# Patient Record
Sex: Female | Born: 1985 | Race: White | Hispanic: No | Marital: Married | Smoking: Never smoker
Health system: Southern US, Community
[De-identification: ages and names within clinical notes are randomized; demographics above are authoritative.]

## PROBLEM LIST (undated history)

## (undated) DIAGNOSIS — Z8742 Personal history of other diseases of the female genital tract: Secondary | ICD-10-CM

## (undated) DIAGNOSIS — Q51810 Arcuate uterus: Secondary | ICD-10-CM

## (undated) DIAGNOSIS — R51 Headache: Secondary | ICD-10-CM

## (undated) DIAGNOSIS — M67439 Ganglion, unspecified wrist: Secondary | ICD-10-CM

## (undated) DIAGNOSIS — R87629 Unspecified abnormal cytological findings in specimens from vagina: Secondary | ICD-10-CM

## (undated) DIAGNOSIS — N200 Calculus of kidney: Secondary | ICD-10-CM

## (undated) HISTORY — DX: Personal history of other diseases of the female genital tract: Z87.42

## (undated) HISTORY — DX: Unspecified abnormal cytological findings in specimens from vagina: R87.629

## (undated) HISTORY — PX: CARPAL TUNNEL RELEASE: SHX101

## (undated) HISTORY — PX: LAPAROSCOPIC CHOLECYSTECTOMY: SUR755

## (undated) HISTORY — DX: Arcuate uterus: Q51.810

## (undated) HISTORY — DX: Calculus of kidney: N20.0

## (undated) HISTORY — PX: LEEP: SHX91

## (undated) HISTORY — PX: COLPOSCOPY: SHX161

---

## 2011-10-22 DIAGNOSIS — M67439 Ganglion, unspecified wrist: Secondary | ICD-10-CM

## 2011-10-22 HISTORY — DX: Ganglion, unspecified wrist: M67.439

## 2011-11-21 ENCOUNTER — Encounter (HOSPITAL_BASED_OUTPATIENT_CLINIC_OR_DEPARTMENT_OTHER): Payer: Self-pay | Admitting: *Deleted

## 2011-11-22 ENCOUNTER — Other Ambulatory Visit: Payer: Self-pay | Admitting: Orthopedic Surgery

## 2011-11-27 NOTE — H&P (Signed)
  Terri Zavala is an 26 y.o. female.   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 She has had prior right hand symptoms with numbness and is s/p right carpal tunnel release by Dr. Mayford Knife in [place], [place] in 2008. She has had pain doing push ups and with full palmar flexion of her wrist for at least 4 years. She now notes a bump in the 4th dorsal compartment consistent with subretinacular dorsal ganglion.     Past Medical History  Diagnosis Date  . Headache     migraines  . Ganglion cyst of wrist 10/2011    right    Past Surgical History  Procedure Date  . Laparoscopic cholecystectomy   . Carpal tunnel release     right    History reviewed. No pertinent family history. Social History:  reports that she has never smoked. She has never used smokeless tobacco. She reports that she drinks alcohol. She reports that she does not use illicit drugs.  Allergies:  Allergies  Allergen Reactions  . Imitrex (Sumatriptan) Other (See Comments)    TACHYCARDIA  . Latex Hives  . Adhesive (Tape) Other (See Comments)    SKIN IRRITATION    No prescriptions prior to admission    No results found for this or any previous visit (from the past 48 hour(s)).  No results found.   Pertinent items are noted in HPI.  Height 5\' 5"  (1.651 m), weight 62.596 kg (138 lb), last menstrual period 11/16/2011.  General appearance: alert Head: Normocephalic, without obvious abnormality Neck: supple, symmetrical, trachea midline Resp: clear to auscultation bilaterally Cardio: regular rate and rhythm GI: normal findings: bowel sounds normal Extremities:  Inspection of her hands reveals a 1.5 by 1.2 mm mass on the dorsal aspect of her left wrist most notable with full palmar flexion of the wrist. She is tender on direct palpation. She cannot do a push up. She has full ROM of her fingers in flexion/extension. Her pulses and capillary refill are  unremarkable.   X-rays of her wrist 4 views demonstrate normal bony anatomy. She is ulnar neutral. There is no sign of scapholunate diastasis. Her lunate is neutral on the lateral view.    Pulses: 2+ and symmetric Skin: normal Neurologic: Grossly normal    Assessment/Plan Impression:Right wrist dorsal ganglion cyst  Plan:Excision cyst dorsum right wrist.The procedure, risks,benefits and post-op course were discussed with the patient at length and they were in agreement with the plan.   DASNOIT,Saathvik Every J 11/27/2011, 2:20 PM     H&P documentation: 11/28/2011  -History and Physical Reviewed  -Patient has been re-examined  -No change in the plan of care  Wyn Forster, MD

## 2011-11-28 ENCOUNTER — Ambulatory Visit (HOSPITAL_BASED_OUTPATIENT_CLINIC_OR_DEPARTMENT_OTHER): Payer: BC Managed Care – PPO | Admitting: Certified Registered Nurse Anesthetist

## 2011-11-28 ENCOUNTER — Encounter (HOSPITAL_BASED_OUTPATIENT_CLINIC_OR_DEPARTMENT_OTHER): Payer: Self-pay | Admitting: *Deleted

## 2011-11-28 ENCOUNTER — Encounter (HOSPITAL_BASED_OUTPATIENT_CLINIC_OR_DEPARTMENT_OTHER): Payer: Self-pay | Admitting: Certified Registered Nurse Anesthetist

## 2011-11-28 ENCOUNTER — Encounter (HOSPITAL_BASED_OUTPATIENT_CLINIC_OR_DEPARTMENT_OTHER)
Admission: RE | Disposition: A | Discharge status: Home / Self Care | Payer: Self-pay | Source: Ambulatory Visit | Attending: Orthopedic Surgery

## 2011-11-28 ENCOUNTER — Ambulatory Visit (HOSPITAL_BASED_OUTPATIENT_CLINIC_OR_DEPARTMENT_OTHER)
Admission: RE | Admit: 2011-11-28 | Discharge: 2011-11-28 | Disposition: A | Discharge status: Home / Self Care | Payer: BC Managed Care – PPO | Source: Ambulatory Visit | Attending: Orthopedic Surgery | Admitting: Orthopedic Surgery

## 2011-11-28 DIAGNOSIS — M6740 Ganglion, unspecified site: Secondary | ICD-10-CM | POA: Insufficient documentation

## 2011-11-28 DIAGNOSIS — Z91040 Latex allergy status: Secondary | ICD-10-CM | POA: Insufficient documentation

## 2011-11-28 DIAGNOSIS — G43909 Migraine, unspecified, not intractable, without status migrainosus: Secondary | ICD-10-CM | POA: Insufficient documentation

## 2011-11-28 HISTORY — DX: Ganglion, unspecified wrist: M67.439

## 2011-11-28 HISTORY — DX: Headache: R51

## 2011-11-28 HISTORY — PX: GANGLION CYST EXCISION: SHX1691

## 2011-11-28 LAB — POCT HEMOGLOBIN-HEMACUE: Hemoglobin: 13.9 g/dL (ref 12.0–15.0)

## 2011-11-28 SURGERY — EXCISION, GANGLION CYST, WRIST
Anesthesia: General | Site: Wrist | Laterality: Right | Wound class: Clean

## 2011-11-28 MED ORDER — LIDOCAINE HCL (CARDIAC) 20 MG/ML IV SOLN
INTRAVENOUS | Status: DC | PRN
  Administered 2011-11-28: 60 mg via INTRAVENOUS

## 2011-11-28 MED ORDER — DEXAMETHASONE SODIUM PHOSPHATE 10 MG/ML IJ SOLN
INTRAMUSCULAR | Status: DC | PRN
  Administered 2011-11-28: 10 mg via INTRAVENOUS

## 2011-11-28 MED ORDER — LIDOCAINE HCL 2 % IJ SOLN
INTRAMUSCULAR | Status: DC | PRN
  Administered 2011-11-28: 2 mL

## 2011-11-28 MED ORDER — DOXYCYCLINE HYCLATE 100 MG PO TABS: 100.0000 mg | ORAL_TABLET | Freq: Two times a day (BID) | ORAL | Status: DC

## 2011-11-28 MED ORDER — PROPOFOL 10 MG/ML IV BOLUS
INTRAVENOUS | Status: DC | PRN
  Administered 2011-11-28: 200 mg via INTRAVENOUS

## 2011-11-28 MED ORDER — ONDANSETRON HCL 4 MG/2ML IJ SOLN: 4.0000 mg | Freq: Once | INTRAMUSCULAR | Status: DC | PRN

## 2011-11-28 MED ORDER — CHLORHEXIDINE GLUCONATE 4 % EX LIQD: 60.0000 mL | Freq: Once | CUTANEOUS | Status: DC

## 2011-11-28 MED ORDER — LACTATED RINGERS IV SOLN
INTRAVENOUS | Status: DC
  Administered 2011-11-28 (×2): via INTRAVENOUS

## 2011-11-28 MED ORDER — MIDAZOLAM HCL 5 MG/5ML IJ SOLN
INTRAMUSCULAR | Status: DC | PRN
  Administered 2011-11-28: 2 mg via INTRAVENOUS

## 2011-11-28 MED ORDER — FENTANYL CITRATE 0.05 MG/ML IJ SOLN
INTRAMUSCULAR | Status: DC | PRN
  Administered 2011-11-28: 50 ug via INTRAVENOUS

## 2011-11-28 MED ORDER — HYDROMORPHONE HCL PF 1 MG/ML IJ SOLN
0.2500 mg | INTRAMUSCULAR | Status: DC | PRN
  Administered 2011-11-28 (×2): 0.25 mg via INTRAVENOUS

## 2011-11-28 MED ORDER — OXYCODONE HCL 5 MG/5ML PO SOLN
5.0000 mg | Freq: Once | ORAL | Status: DC | PRN
Start: 2011-11-28 — End: 2011-11-28

## 2011-11-28 MED ORDER — SCOPOLAMINE 1 MG/3DAYS TD PT72
MEDICATED_PATCH | TRANSDERMAL | Status: DC | PRN
  Administered 2011-11-28: 1 via TRANSDERMAL

## 2011-11-28 MED ORDER — ONDANSETRON HCL 4 MG/2ML IJ SOLN
INTRAMUSCULAR | Status: DC | PRN
  Administered 2011-11-28: 4 mg via INTRAVENOUS

## 2011-11-28 MED ORDER — OXYCODONE HCL 5 MG PO TABS: 5.0000 mg | ORAL_TABLET | Freq: Once | ORAL | Status: DC | PRN

## 2011-11-28 SURGICAL SUPPLY — 44 items
BANDAGE ADHESIVE 1X3 (GAUZE/BANDAGES/DRESSINGS) IMPLANT
BANDAGE ELASTIC 3 VELCRO ST LF (GAUZE/BANDAGES/DRESSINGS) ×2 IMPLANT
BANDAGE GAUZE ELAST BULKY 4 IN (GAUZE/BANDAGES/DRESSINGS) IMPLANT
BLADE MINI RND TIP GREEN BEAV (BLADE) IMPLANT
BLADE SURG 15 STRL LF DISP TIS (BLADE) ×1 IMPLANT
BLADE SURG 15 STRL SS (BLADE) ×1
BNDG ESMARK 4X9 LF (GAUZE/BANDAGES/DRESSINGS) ×2 IMPLANT
BRUSH SCRUB EZ PLAIN DRY (MISCELLANEOUS) ×2 IMPLANT
CLOTH BEACON ORANGE TIMEOUT ST (SAFETY) ×2 IMPLANT
CORDS BIPOLAR (ELECTRODE) ×2 IMPLANT
COVER MAYO STAND STRL (DRAPES) ×2 IMPLANT
COVER TABLE BACK 60X90 (DRAPES) ×2 IMPLANT
CUFF TOURNIQUET SINGLE 18IN (TOURNIQUET CUFF) ×2 IMPLANT
DECANTER SPIKE VIAL GLASS SM (MISCELLANEOUS) ×2 IMPLANT
DRAPE EXTREMITY T 121X128X90 (DRAPE) ×2 IMPLANT
DRAPE SURG 17X23 STRL (DRAPES) ×2 IMPLANT
GLOVE BIOGEL M STRL SZ7.5 (GLOVE) IMPLANT
GLOVE BIOGEL PI IND STRL 7.0 (GLOVE) ×1 IMPLANT
GLOVE BIOGEL PI IND STRL 7.5 (GLOVE) ×2 IMPLANT
GLOVE BIOGEL PI INDICATOR 7.0 (GLOVE) ×1
GLOVE BIOGEL PI INDICATOR 7.5 (GLOVE) ×2
GLOVE ORTHO TXT STRL SZ7.5 (GLOVE) IMPLANT
GOWN PREVENTION PLUS XLARGE (GOWN DISPOSABLE) ×2 IMPLANT
GOWN PREVENTION PLUS XXLARGE (GOWN DISPOSABLE) ×4 IMPLANT
NEEDLE 27GAX1X1/2 (NEEDLE) ×2 IMPLANT
PACK BASIN DAY SURGERY FS (CUSTOM PROCEDURE TRAY) ×2 IMPLANT
PAD CAST 3X4 CTTN HI CHSV (CAST SUPPLIES) ×1 IMPLANT
PADDING CAST ABS 4INX4YD NS (CAST SUPPLIES) ×1
PADDING CAST ABS COTTON 4X4 ST (CAST SUPPLIES) ×1 IMPLANT
PADDING CAST COTTON 3X4 STRL (CAST SUPPLIES) ×1
SPLINT PLASTER CAST XFAST 3X15 (CAST SUPPLIES) ×4 IMPLANT
SPLINT PLASTER XTRA FASTSET 3X (CAST SUPPLIES) ×4
SPONGE GAUZE 4X4 12PLY (GAUZE/BANDAGES/DRESSINGS) ×2 IMPLANT
STOCKINETTE 4X48 STRL (DRAPES) ×2 IMPLANT
STRIP CLOSURE SKIN 1/2X4 (GAUZE/BANDAGES/DRESSINGS) IMPLANT
SUT PROLENE 3 0 PS 2 (SUTURE) ×2 IMPLANT
SUT VIC AB 4-0 P-3 18XBRD (SUTURE) ×1 IMPLANT
SUT VIC AB 4-0 P3 18 (SUTURE) ×1
SYR 3ML 23GX1 SAFETY (SYRINGE) IMPLANT
SYR CONTROL 10ML LL (SYRINGE) ×2 IMPLANT
TOWEL OR 17X24 6PK STRL BLUE (TOWEL DISPOSABLE) ×4 IMPLANT
TRAY DSU PREP LF (CUSTOM PROCEDURE TRAY) ×2 IMPLANT
UNDERPAD 30X30 INCONTINENT (UNDERPADS AND DIAPERS) ×2 IMPLANT
WATER STERILE IRR 1000ML POUR (IV SOLUTION) IMPLANT

## 2011-11-28 NOTE — Transfer of Care (Signed)
Immediate Anesthesia Transfer of Care Note  Patient: Terri Zavala  Procedure(s) Performed: Procedure(s) (LRB) with comments: REMOVAL GANGLION OF WRIST (Right) - CYST EXCISION RIGHT WRIST  Patient Location: PACU  Anesthesia Type:General  Level of Consciousness: awake, alert , oriented and patient cooperative  Airway & Oxygen Therapy: Patient Spontanous Breathing and Patient connected to face mask oxygen  Post-op Assessment: Report given to PACU RN and Post -op Vital signs reviewed and stable  Post vital signs: Reviewed and stable  Complications: No apparent anesthesia complications

## 2011-11-28 NOTE — Brief Op Note (Signed)
11/28/2011  8:20 AM  PATIENT:  Terri Zavala  26 y.o. female  PRE-OPERATIVE DIAGNOSIS:  DORSAL WRIST MYXOID CYST  POST-OPERATIVE DIAGNOSIS:  RIGHT WRIST SCAPHOLUNATE MYXOID DEGENERATIVE CYST  PROCEDURE:  EXCISION OF RIGHT WRIST SCAPHOLUNATE MYXOID CYST  SURGEON:  Surgeon(s) and Role:    * Wyn Forster., MD - Primary  PHYSICIAN ASSISTANT:   ASSISTANTS:Charlene Cowdrey Dasnoit,P.A-C   ANESTHESIA:   general  EBL:  Total I/O In: 1000 [I.V.:1000] Out: -   BLOOD ADMINISTERED:none  DRAINS: none   LOCAL MEDICATIONS USED:  XYLOCAINE   SPECIMEN:  No Specimen  DISPOSITION OF SPECIMEN:  N/A  COUNTS:  YES  TOURNIQUET:   Total Tourniquet Time Documented: Upper Arm (Right) - 21 minutes  DICTATION: .Other Dictation: Dictation Number [id number]  PLAN OF CARE: Discharge to home after PACU  PATIENT DISPOSITION:  PACU - hemodynamically stable.

## 2011-11-28 NOTE — Anesthesia Postprocedure Evaluation (Signed)
  Anesthesia Post-op Note  Patient: Terri Zavala  Procedure(s) Performed: Procedure(s) (LRB) with comments: REMOVAL GANGLION OF WRIST (Right) - CYST EXCISION RIGHT WRIST  Patient Location: PACU  Anesthesia Type:General  Level of Consciousness: awake, alert  and oriented  Airway and Oxygen Therapy: Patient Spontanous Breathing and Patient connected to face mask oxygen  Post-op Pain: mild  Post-op Assessment: Post-op Vital signs reviewed  Post-op Vital Signs: Reviewed  Complications: No apparent anesthesia complications

## 2011-11-28 NOTE — Anesthesia Preprocedure Evaluation (Signed)

## 2011-11-28 NOTE — Anesthesia Procedure Notes (Signed)
Procedure Name: LMA Insertion Date/Time: 11/28/2011 7:41 AM Performed by: Braeden Kennan D Pre-anesthesia Checklist: Patient identified, Emergency Drugs available, Suction available and Patient being monitored Patient Re-evaluated:Patient Re-evaluated prior to inductionOxygen Delivery Method: Circle System Utilized Preoxygenation: Pre-oxygenation with 100% oxygen Intubation Type: IV induction Ventilation: Mask ventilation without difficulty LMA: LMA inserted LMA Size: 4.0 Number of attempts: 1 Airway Equipment and Method: bite block Placement Confirmation: positive ETCO2 Tube secured with: Tape Dental Injury: Teeth and Oropharynx as per pre-operative assessment

## 2011-11-28 NOTE — Op Note (Signed)
[id number]  

## 2011-11-29 ENCOUNTER — Encounter (HOSPITAL_BASED_OUTPATIENT_CLINIC_OR_DEPARTMENT_OTHER): Payer: Self-pay | Admitting: Orthopedic Surgery

## 2011-12-02 NOTE — Op Note (Signed)
NAMECHARLITA, Zavala NO.:  [id number]  MEDICAL RECORD NO.:  [id number]  LOCATION:                                 FACILITY:  PHYSICIAN:  Katy Fitch. Zadaya Cuadra, M.D. DATE OF BIRTH:  28-Jul-1985  DATE OF PROCEDURE:  11/28/2011 DATE OF DISCHARGE:                              OPERATIVE REPORT   PREOPERATIVE DIAGNOSIS:  Three-year history of painful region dorsal aspect of right wrist with recent development of a palpable mass consistent with a sub-retinacular dorsal myxoid degenerative cyst.  POSTOPERATIVE DIAGNOSIS:  A 7 x 6-mm tense scapholunate myxoid cyst, intracapsular likely causing compression of posterior interosseous nerve, also very small flap tear of scapholunate ligament fibers based on lunate.  OPERATION:  Debridement of intracapsular, sub-retinacular myxoid cyst, right wrist.  OPERATING SURGEON:  Katy Fitch. Berk Pilot, M.D.  ASSISTANT:  Marveen Reeks Dasnoit, PA-C  ANESTHESIA:  General by LMA.  SUPERVISING ANESTHESIOLOGIST:  Sheldon Silvan, M.D.  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  She has had a 3-year history of increasing discomfort on the dorsal aspect of her right dominant wrist.  She was examined by my colleague, Cindee Salt 3 years prior and was thought to have a partial scapholunate interosseous ligament injury.  Over time, [name] developed a mass on the dorsal aspect of her wrist that was palpable in palmar flexion and had chronic discomfort with load- bearing in a push-up position.  She presented for evaluation several weeks prior and was noted to have a palpable sub-retinacular cyst.  We had detailed informed consent during, which we explained that the myxoid cyst phenomena was poorly understood.  This typically rise on the scapholunate ligament with a vague history  of injury.  They are typically debrided removing pressure on the posterior interosseous nerve.  However, the prognosis is in my experience uncertain.  The literature does not clearly document a natural history of this disorder.  After detailed informed consent, she decided to proceed with debridement.  Questions regarding the surgery were invited and answered in detail. After being provided a spectrum of options for anesthesia including local standby versus regional versus general by LMA, [name] chose general by LMA.  PROCEDURE:  [name] was brought to room #2 of the [facility] and placed in supine position on the operating table.  Following a detailed anesthesia and informed consent by Dr. Ivin Booty, general anesthesia by LMA technique was recommended and accepted.  In room #2 under Dr. Ivin Booty' direct supervision, general anesthesia by LMA technique was induced followed by routine Betadine scrub and paint of the right upper extremity.  A pneumatic tourniquet was applied to the proximal right brachium and set at 220 mmHg.  Following routine surgical time-out, the arm and hand were exsanguinated with an Esmarch bandage and the arterial tourniquet was inflated 220 mmHg.  Procedure commenced with a short transverse incision directly over the palpable mass.  Subcutaneous tissues were carefully divided taking care to identify and spare the radial superficial sensory branches.  The extensor retinaculum and distal  margins were identified and soft tissues spread allowing proximal retraction of the retinaculum.  The mass was easily palpable through the capsule of the wrist joint.  The capsule was split longitudinally sparing the dorsal intercarpal ligaments and a mass measuring 7 x 6 mm identified directly on the scapholunate ligament.  Four Ragnell retractors were placed and the mass documented with digital camera.  We subsequently removed the mass with a rongeur and  as it was removed, we drained approximately 0.5 mL of mucinous contents.  There was a small flap tear of the scapholunate ligament that was based on the lunate.  This was simply debrided with the microrongeur.  Bleeding points were electrocauterized with bipolar current as well as electrodesiccation of the superficial cells of the ligament.  The wound was then inspected for bleeding points followed by repair of the skin with subcutaneous 4-0 Vicryl and intradermal 3-0 Prolene with a Steri-Strip.  Procedures was performed at latex-free conditions due to a history of LATEX allergy.  For aftercare, [name] was placed in a volar wrist forearm splint with the wrist in 15 degrees of dorsiflexion.  Plan is to remove the splint at 7-8 days with removal of the intradermal Prolene suture.  Questions will be invited and answered in detail in the holding area postoperatively.     Katy Fitch Navraj Dreibelbis, M.D.     RVS/MEDQ  D:  11/28/2011  T:  11/28/2011  Job:  [id number]  cc:   Betha Loa, MD

## 2012-03-06 ENCOUNTER — Other Ambulatory Visit (HOSPITAL_COMMUNITY): Payer: Self-pay | Admitting: Obstetrics

## 2012-03-06 DIAGNOSIS — N979 Female infertility, unspecified: Secondary | ICD-10-CM

## 2012-03-07 ENCOUNTER — Ambulatory Visit (HOSPITAL_COMMUNITY)
Admission: RE | Admit: 2012-03-07 | Discharge: 2012-03-07 | Disposition: A | Discharge status: Home / Self Care | Payer: BC Managed Care – PPO | Source: Ambulatory Visit | Attending: Obstetrics | Admitting: Obstetrics

## 2012-03-07 DIAGNOSIS — N979 Female infertility, unspecified: Secondary | ICD-10-CM

## 2012-09-02 LAB — OB RESULTS CONSOLE RUBELLA ANTIBODY, IGM: RUBELLA: IMMUNE

## 2012-09-02 LAB — OB RESULTS CONSOLE HEPATITIS B SURFACE ANTIGEN: HEP B S AG: NEGATIVE

## 2012-09-02 LAB — OB RESULTS CONSOLE GC/CHLAMYDIA
CHLAMYDIA, DNA PROBE: NEGATIVE
Gonorrhea: NEGATIVE

## 2012-09-02 LAB — OB RESULTS CONSOLE ABO/RH: RH Type: POSITIVE

## 2012-09-02 LAB — OB RESULTS CONSOLE ANTIBODY SCREEN: Antibody Screen: NEGATIVE

## 2012-09-02 LAB — OB RESULTS CONSOLE RPR: RPR: NONREACTIVE

## 2012-09-02 LAB — OB RESULTS CONSOLE HIV ANTIBODY (ROUTINE TESTING): HIV: NONREACTIVE

## 2012-10-29 ENCOUNTER — Inpatient Hospital Stay (HOSPITAL_COMMUNITY)
Admission: AD | Admit: 2012-10-29 | Payer: BC Managed Care – PPO | Source: Ambulatory Visit | Admitting: Obstetrics and Gynecology

## 2013-02-22 LAB — OB RESULTS CONSOLE GBS: GBS: NEGATIVE

## 2013-03-26 ENCOUNTER — Encounter (HOSPITAL_COMMUNITY): Payer: Self-pay | Admitting: *Deleted

## 2013-03-26 ENCOUNTER — Telehealth (HOSPITAL_COMMUNITY): Payer: Self-pay | Admitting: *Deleted

## 2013-03-26 NOTE — Telephone Encounter (Signed)
Preadmission screen  

## 2013-03-28 ENCOUNTER — Inpatient Hospital Stay (HOSPITAL_COMMUNITY)
Admission: AD | Admit: 2013-03-28 | Discharge: 2013-03-30 | DRG: 775 | Disposition: A | Discharge status: Home / Self Care | Payer: BC Managed Care – PPO | Source: Ambulatory Visit | Attending: Obstetrics and Gynecology | Admitting: Obstetrics and Gynecology

## 2013-03-28 ENCOUNTER — Encounter (HOSPITAL_COMMUNITY): Payer: Self-pay | Admitting: *Deleted

## 2013-03-28 LAB — CBC
HEMATOCRIT: 31.5 % — AB (ref 36.0–46.0)
HEMOGLOBIN: 10.9 g/dL — AB (ref 12.0–15.0)
MCH: 30 pg (ref 26.0–34.0)
MCHC: 34.6 g/dL (ref 30.0–36.0)
MCV: 86.8 fL (ref 78.0–100.0)
Platelets: 219 10*3/uL (ref 150–400)
RBC: 3.63 MIL/uL — ABNORMAL LOW (ref 3.87–5.11)
RDW: 13.3 % (ref 11.5–15.5)
WBC: 16.3 10*3/uL — ABNORMAL HIGH (ref 4.0–10.5)

## 2013-03-28 LAB — RPR: RPR Ser Ql: NONREACTIVE

## 2013-03-28 MED ORDER — IBUPROFEN 600 MG PO TABS: 600.0000 mg | ORAL_TABLET | Freq: Four times a day (QID) | ORAL | Status: DC | PRN

## 2013-03-28 MED ORDER — LIDOCAINE HCL (PF) 1 % IJ SOLN
30.0000 mL | INTRAMUSCULAR | Status: DC | PRN
  Administered 2013-03-28: 30 mL via SUBCUTANEOUS
  Filled 2013-03-28: qty 30

## 2013-03-28 MED ORDER — OXYTOCIN 40 UNITS IN LACTATED RINGERS INFUSION - SIMPLE MED
62.5000 mL/h | INTRAVENOUS | Status: DC
  Filled 2013-03-28: qty 1000

## 2013-03-28 MED ORDER — DIPHENHYDRAMINE HCL 50 MG/ML IJ SOLN: 12.5000 mg | INTRAMUSCULAR | Status: DC | PRN

## 2013-03-28 MED ORDER — OXYTOCIN BOLUS FROM INFUSION: 500.0000 mL | INTRAVENOUS | Status: DC

## 2013-03-28 MED ORDER — EPHEDRINE 5 MG/ML INJ
10.0000 mg | INTRAVENOUS | Status: DC | PRN
  Filled 2013-03-28: qty 2

## 2013-03-28 MED ORDER — ACETAMINOPHEN 325 MG PO TABS: 650.0000 mg | ORAL_TABLET | ORAL | Status: DC | PRN

## 2013-03-28 MED ORDER — LACTATED RINGERS IV SOLN: 500.0000 mL | INTRAVENOUS | Status: DC | PRN

## 2013-03-28 MED ORDER — LACTATED RINGERS IV SOLN: 500.0000 mL | Freq: Once | INTRAVENOUS | Status: DC

## 2013-03-28 MED ORDER — FENTANYL 2.5 MCG/ML BUPIVACAINE 1/10 % EPIDURAL INFUSION (WH - ANES): 14.0000 mL/h | INTRAMUSCULAR | Status: DC | PRN

## 2013-03-28 MED ORDER — BUTORPHANOL TARTRATE 1 MG/ML IJ SOLN
1.0000 mg | Freq: Once | INTRAMUSCULAR | Status: AC
  Administered 2013-03-28: 1 mg via INTRAVENOUS
  Filled 2013-03-28: qty 1

## 2013-03-28 MED ORDER — CITRIC ACID-SODIUM CITRATE 334-500 MG/5ML PO SOLN: 30.0000 mL | ORAL | Status: DC | PRN

## 2013-03-28 MED ORDER — LACTATED RINGERS IV SOLN
INTRAVENOUS | Status: DC
  Administered 2013-03-28: 14:00:00 via INTRAVENOUS

## 2013-03-28 MED ORDER — OXYCODONE-ACETAMINOPHEN 5-325 MG PO TABS: 1.0000 | ORAL_TABLET | ORAL | Status: DC | PRN

## 2013-03-28 MED ORDER — PHENYLEPHRINE 40 MCG/ML (10ML) SYRINGE FOR IV PUSH (FOR BLOOD PRESSURE SUPPORT)
80.0000 ug | PREFILLED_SYRINGE | INTRAVENOUS | Status: DC | PRN
  Filled 2013-03-28: qty 2

## 2013-03-28 MED ORDER — ONDANSETRON HCL 4 MG/2ML IJ SOLN
4.0000 mg | Freq: Four times a day (QID) | INTRAMUSCULAR | Status: DC | PRN
  Administered 2013-03-28 (×2): 4 mg via INTRAVENOUS
  Filled 2013-03-28 (×2): qty 2

## 2013-03-28 NOTE — Progress Notes (Signed)
Lab at bs

## 2013-03-28 NOTE — MAU Note (Signed)
Contractions started last night. Water leaking since 0930.  BP elevated recently

## 2013-03-28 NOTE — Progress Notes (Signed)
Patient ID: Mora BellmanRenee Zavala, female   DOB: 1985-04-30, 28 y.o.   MRN: [id number] Delivery note:  The pt reached full dilatation and pushed well. She brought the vertex to the perineum and delivered spontaneously ROA a living female infant with Apgars of 9 and 9 at 1 and 5 minutes. The placenta was expelled intact and the uterus was normal. Rectal was negative but there was a third degree laceration. The tissue was very edematous and with the lack of an epidural and her swollen tissue, visibility was difficult. I grasped the sphincter with 2 Allis clamps and repaired the sphincter with 2 figure of 8 sutures of 2-0 vicryl. The remainder of the laceration was repaired with 3-0 vicryl. The rectal was repeated and the sphincter felt good. EBL 500 cc's. Prior to the repair and removal of the placenta a cord blood collection was done under sterile conditions. 7-8 inches of cord were retained for a tissue specimen

## 2013-03-28 NOTE — Progress Notes (Signed)
Patient ID: Terri BellmanRenee Zavala, female   DOB: 11-24-1985, 28 y.o.   MRN: [id number] There is still an anterior lip that would probably stay away if it were held during a contraction and the pt pushed but she prefers to wait another  hour to see if the lip melts away. The FHR base line has changed to the 110's but there are no decelerations. The vertex is at 0/+1 station.

## 2013-03-28 NOTE — H&P (Signed)
NAMTim Lair:  Wideman, Hanne                 ACCOUNT NO.:  [id number]  MEDICAL RECORD NO.:  [id number]  LOCATION:  9164                          [facility]ACILITY:  [facility]  PHYSICIAN:  Malachi Prohomas F. Ambrose MantleHenley, M.D. DATE OF BIRTH:  [birth year]  DATE OF ADMISSION:  03/28/2013 DATE OF DISCHARGE:                             HISTORY & PHYSICAL   PRESENT ILLNESS:  This is a 28 year old white female, para 0, gravida 1, EDC March 28, 2013 who is admitted with labor.  Blood group and type O positive, negative antibody.  Pap smear normal.  Rubella immune.  RPR nonreactive.  Urine culture negative.  Hepatitis B surface antigen negative, HIV negative, cystic fibrosis negative.  First trimester screen negative, AFP negative, 1 hour Glucola 92 and group B strep negative.  Ultrasound at 12 weeks gave a due date of March 28, 2013 compatible with her last period.  She had an uncomplicated prenatal course.  She states that she thinks she ruptured her membranes at 9:45 am on the day of admission.  She began contracting, came to the hospital, was thought to be 5 cm dilated and was admitted.  She has a request to have minimal intervention.  Also, she wants her cord blood drawn.  ILLNESSES:  Psoriasis or eczema since child.  Migraines, treated with acupuncture.  Kidney stone in 2000.  OPERATIONS:  On November, 2013; the ganglion cyst from her wrist 2008. Cholecystectomy, carpal tunnel release, LEEP procedure.  ALLERGIES:  Imitrex causes anaphylaxis, latex itching, shellfish and latex also caused hives.  FAMILY HISTORY:  None recorded.  PHYSICAL EXAMINATION:  VITAL SIGNS:  Temperature in 98, pulse 77, respirations 18, blood pressure 117/77. HEART:  Normal size and sounds.  No murmurs. LUNGS:  Clear to auscultation. ABDOMEN:  Soft.  Fundal height at her last visit on March 25, 2013 was 38 cm.  Blood pressure at that visit was 146/86. CERVIX:  Per the RN in the  MAC was 5 cm, 100% vertex presentation.  ADMITTING IMPRESSION:   Intrauterine pregnancy 40 weeks, active labor. Patient declines any intervention.  Does not want an epidural.  Does not want IV fluids, only keep on IV.  She wants to have a telemetry monitor. We will attempt to treat her according to her wishes as long as I feel like it is safe for her and the baby.    Malachi Prohomas F. Ambrose MantleHenley, M.D.    TFH/MEDQ  D:  03/28/2013  T:  03/28/2013  Job:  [id number]

## 2013-03-28 NOTE — Progress Notes (Signed)
Dr Ulanda Edison notified [facility]co works very intermittently and pt requests telemetry-dr henley ok with current fhr monitoring and monitor uc's by observation and fhr continuous. Also, saline lock ok'd. Lab called and requested to draw maternal blood for cord kit.

## 2013-03-28 NOTE — Progress Notes (Signed)
Patient ID: Terri BellmanRenee Zavala, female   DOB: 29-Jan-1985, 28 y.o.   MRN: [id number] Because of slow progress the pt was offered pitocin earlier but she declined. The cervix was 9+ cm at 6:28 PM and remains the same . Even though she had SROM at 9:45 AM there is a bulging BOW that I ruptured with clear fluid. I had a discussion with the pt and her husband and she accepts whatever I need to do if she or the baby is in distress. She is again offered an epidural but declines. She is also offered IV pain med which she declines. The FHR is normal.

## 2013-03-29 ENCOUNTER — Encounter (HOSPITAL_COMMUNITY): Payer: Self-pay

## 2013-03-29 LAB — CBC
HCT: 25.6 % — ABNORMAL LOW (ref 36.0–46.0)
Hemoglobin: 9.1 g/dL — ABNORMAL LOW (ref 12.0–15.0)
MCH: 30.5 pg (ref 26.0–34.0)
MCHC: 35.5 g/dL (ref 30.0–36.0)
MCV: 85.9 fL (ref 78.0–100.0)
Platelets: 240 10*3/uL (ref 150–400)
RBC: 2.98 MIL/uL — AB (ref 3.87–5.11)
RDW: 13.7 % (ref 11.5–15.5)
WBC: 24 10*3/uL — ABNORMAL HIGH (ref 4.0–10.5)

## 2013-03-29 LAB — ABO/RH: ABO/RH(D): O POS

## 2013-03-29 MED ORDER — DIBUCAINE 1 % RE OINT: 1.0000 "application " | TOPICAL_OINTMENT | RECTAL | Status: DC | PRN

## 2013-03-29 MED ORDER — PRENATAL 27-0.8 MG PO TABS: 1.0000 | ORAL_TABLET | Freq: Every day | ORAL | Status: DC

## 2013-03-29 MED ORDER — PRENATAL MULTIVITAMIN CH
1.0000 | ORAL_TABLET | Freq: Every day | ORAL | Status: DC
  Administered 2013-03-29: 1 via ORAL
  Filled 2013-03-29: qty 1

## 2013-03-29 MED ORDER — IBUPROFEN 600 MG PO TABS
600.0000 mg | ORAL_TABLET | Freq: Four times a day (QID) | ORAL | Status: DC
  Administered 2013-03-29 – 2013-03-30 (×6): 600 mg via ORAL
  Filled 2013-03-29 (×6): qty 1

## 2013-03-29 MED ORDER — OXYTOCIN 40 UNITS IN LACTATED RINGERS INFUSION - SIMPLE MED: 62.5000 mL/h | INTRAVENOUS | Status: AC | PRN

## 2013-03-29 MED ORDER — DIPHENHYDRAMINE HCL 25 MG PO CAPS: 25.0000 mg | ORAL_CAPSULE | Freq: Four times a day (QID) | ORAL | Status: DC | PRN

## 2013-03-29 MED ORDER — WITCH HAZEL-GLYCERIN EX PADS: 1.0000 "application " | MEDICATED_PAD | CUTANEOUS | Status: DC | PRN

## 2013-03-29 MED ORDER — ZOLPIDEM TARTRATE 5 MG PO TABS: 5.0000 mg | ORAL_TABLET | Freq: Every evening | ORAL | Status: DC | PRN

## 2013-03-29 MED ORDER — TETANUS-DIPHTH-ACELL PERTUSSIS 5-2.5-18.5 LF-MCG/0.5 IM SUSP: 0.5000 mL | Freq: Once | INTRAMUSCULAR | Status: DC

## 2013-03-29 MED ORDER — ONDANSETRON HCL 4 MG/2ML IJ SOLN: 4.0000 mg | INTRAMUSCULAR | Status: DC | PRN

## 2013-03-29 MED ORDER — LACTATED RINGERS IV SOLN: INTRAVENOUS | Status: AC

## 2013-03-29 MED ORDER — MEASLES, MUMPS & RUBELLA VAC ~~LOC~~ INJ
0.5000 mL | INJECTION | Freq: Once | SUBCUTANEOUS | Status: DC
  Filled 2013-03-29: qty 0.5

## 2013-03-29 MED ORDER — LANOLIN HYDROUS EX OINT: TOPICAL_OINTMENT | CUTANEOUS | Status: DC | PRN

## 2013-03-29 MED ORDER — SIMETHICONE 80 MG PO CHEW: 80.0000 mg | CHEWABLE_TABLET | ORAL | Status: DC | PRN

## 2013-03-29 MED ORDER — BENZOCAINE-MENTHOL 20-0.5 % EX AERO
1.0000 "application " | INHALATION_SPRAY | CUTANEOUS | Status: DC | PRN
  Administered 2013-03-29: 1 via TOPICAL
  Filled 2013-03-29: qty 56

## 2013-03-29 MED ORDER — OXYCODONE-ACETAMINOPHEN 5-325 MG PO TABS: 1.0000 | ORAL_TABLET | ORAL | Status: DC | PRN

## 2013-03-29 MED ORDER — SENNOSIDES-DOCUSATE SODIUM 8.6-50 MG PO TABS
2.0000 | ORAL_TABLET | ORAL | Status: DC
  Administered 2013-03-29 (×2): 2 via ORAL
  Filled 2013-03-29 (×2): qty 2

## 2013-03-29 MED ORDER — ONDANSETRON HCL 4 MG PO TABS: 4.0000 mg | ORAL_TABLET | ORAL | Status: DC | PRN

## 2013-03-29 NOTE — Lactation Note (Signed)
This note was copied from the chart of Terri Zavala. Lactation Consultation Note:Initial visit with mom She is latching baby on when I went into room. Reports that baby has been nursing well Reports that her doula Harvin HazelJamilla has helped her. Reviewed wide open mouth and keeping the baby close to the breast throughout the feeding. No questions at present. Bf brochure given with resources for support after DC.To call for assist prn  Patient Name: Terri Mora BellmanRenee Biglow NFAOZ'HToday's Date: 03/29/2013 Reason for consult: Initial assessment   Maternal Data Formula Feeding for Exclusion: No Infant to breast within first hour of birth: Yes Does the patient have breastfeeding experience prior to this delivery?: No  Feeding Feeding Type: Breast Fed Length of feed: 5 min  LATCH Score/Interventions Latch: Grasps breast easily, tongue down, lips flanged, rhythmical sucking.  Audible Swallowing: A few with stimulation  Type of Nipple: Everted at rest and after stimulation  Comfort (Breast/Nipple): Soft / non-tender     Hold (Positioning): Assistance needed to correctly position infant at breast and maintain latch. Intervention(s): Breastfeeding basics reviewed;Support Pillows;Skin to skin  LATCH Score: 8  Lactation Tools Discussed/Used     Consult Status Consult Status: Follow-up Date: 03/30/13 Follow-up type: In-patient    Pamelia HoitWeeks, Aahil Fredin D 03/29/2013, 2:09 PM

## 2013-03-29 NOTE — Progress Notes (Signed)
Patient ID: Terri BellmanRenee Zavala, female   DOB: Jul 19, 1985, 28 y.o.   MRN: [id number] #1 afebrile BP normal no complaints HGB 10.9 to 9.1

## 2013-03-30 ENCOUNTER — Inpatient Hospital Stay (HOSPITAL_COMMUNITY): Admission: RE | Admit: 2013-03-30 | Payer: BC Managed Care – PPO | Source: Ambulatory Visit

## 2013-03-30 MED ORDER — IBUPROFEN 600 MG PO TABS: 600.0000 mg | ORAL_TABLET | Freq: Four times a day (QID) | ORAL | Status: DC | PRN

## 2013-03-30 MED ORDER — FERROUS SULFATE 325 (65 FE) MG PO TABS: 325.0000 mg | ORAL_TABLET | Freq: Two times a day (BID) | ORAL | Status: DC

## 2013-03-30 NOTE — Discharge Instructions (Signed)
booklet °

## 2013-03-30 NOTE — Progress Notes (Signed)
Patient ID: Terri Zavala, female   DOB: 1985/12/16, 28 y.o.   MRN: [id number] #2 afebrile no complaints for d/c

## 2013-03-31 NOTE — Discharge Summary (Addendum)
NAMTim Lair:  Terri Zavala, Terri Zavala                 ACCOUNT NO.:  [id number]  MEDICAL RECORD NO.:  [id number]  LOCATION:  9119                          [facility]ILITY:  [facility]  PHYSICIAN:  Malachi Prohomas F. Ambrose MantleHenley, M.D. DATE OF BIRTH:  07/02/1985  DATE OF ADMISSION:  03/28/2013 DATE OF DISCHARGE:  03/30/2013                              DISCHARGE SUMMARY   A 28 year old white female, para 0, gravida 1, EDC March 28, 2013, admitted with labor.  Lab tests were normal.  The patient thought she ruptured her membranes at 9:45 a.m. on the day of admission.  She began contracting and came to the hospital, was thought to be 5 cm dilated and was admitted.  After admission to the hospital, the patient advised the nursing staff that she did not want IV fluids, only keep open IV, wanted no intervention.  She wanted telemetry monitor, so she could walk around and she did not make any progress.  We offered her Pitocin, but she declined.  The patient became 9+ cm at 6:28 am, and remained the same for another hour.  There was a bulging bag of waters, even though she had ruptured membranes.It was ruptured and had clear fluid.  I had a discussion with the patient and her husband and she stated she accepted whatever I needed to do for her or the baby  .  She was again offered an Epidural but declined.  She was offered IV pain medicine which she declined. At 8:35 p.m., there was still an anterior lip.  The patient reached full dilatation and pushed well.  She brought the vertex to the perineum and delivered spontaneously ROA.  A living female infant 7 pounds 1 ounces with Apgars of 9 and nine at 1 and five minutes.  Placenta was expelled intact.  Uterus was normal.  Rectal was negative, but there was a third- degree laceration and the tissue was very edematous and with the lack of an epidural and her swollen tissue, visibility was difficult.  I grasped the sphincter with 2 Allis clamps and repaired the sphincter with 2 figure-of-eight  sutures of 2-0 Vicryl.  The remainder of the laceration repaired with 3-0 Vicryl.  The rectum was repeated and the sphincter felt good.  Blood loss about 500 mL.  Prior to the repair and removal of the placenta, a cord blood collection was done under sterile conditions at their request for a private cord blood, 7-8 inches of cord were retained for tissue.  Postpartum, the patient did well.  She had no significant complaints and on the second postpartum day, she was ready for discharge.  Initial hemoglobin 10.9, hematocrit 31.5, white count 16300, platelet count 219,000.  RPR nonreactive.  Followup hemoglobin 9.1.  FINAL DIAGNOSIS:  Intrauterine pregnancy at 40 weeks, delivered ROA.  OPERATION:  Spontaneous delivery, ROA, repair of third-degree laceration.  FINAL CONDITION:  Improved.  INSTRUCTIONS:  Include our regular discharge instruction booklet.  The patient is advised to call with any problems.  She has been given after visit summary, prescription for Motrin 600 mg 30 tablets, 1 every 6 hours as needed for pain, ferrous sulfate 325 mg 60 tablets 1 twice a day 3  refills and prescription for breast pump.  The patient is to return in 6 weeks for followup examination.     Malachi Pro. Ambrose Mantle, M.D.     TFH/MEDQ  D:  03/30/2013  T:  03/31/2013  Job:  [id number]

## 2013-06-19 IMAGING — US US FOLLICLE
1 series · 14 of 16 positions shown · non-contrast
Comparison: None

CLINICAL DATA: 26-year-old female with infertility.  Day 12 Femara.

LIMITED ULTRASOUND OF PELVIS

[Series 1: us follicle · 25 acquisitions, 14 frames shown]
[im 1/25]
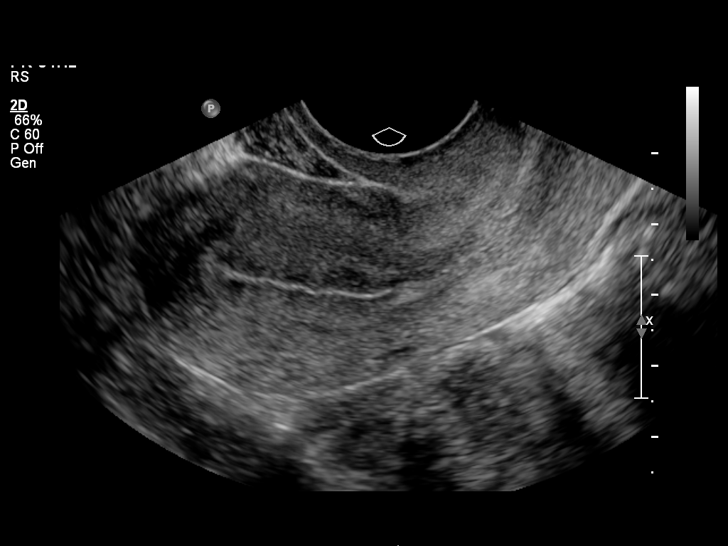
[im 2/25]
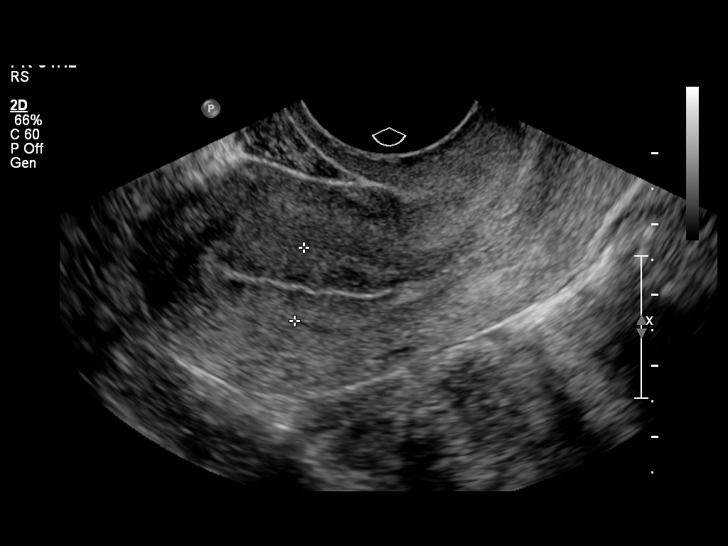
[im 4/25]
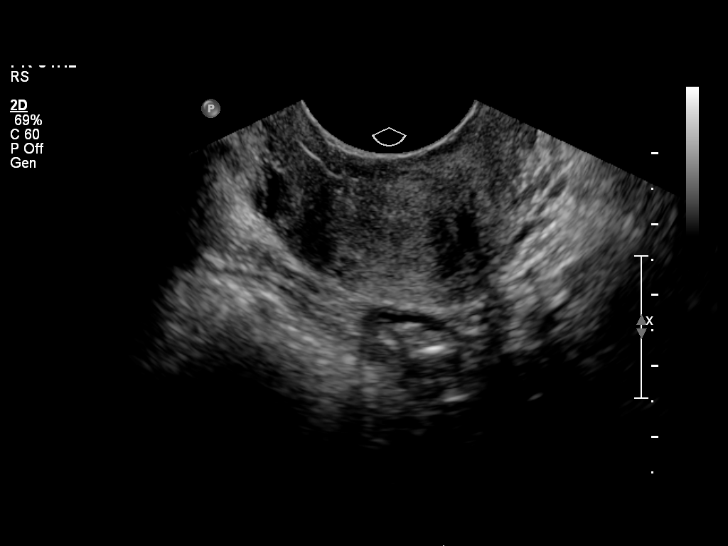
[im 7/25]
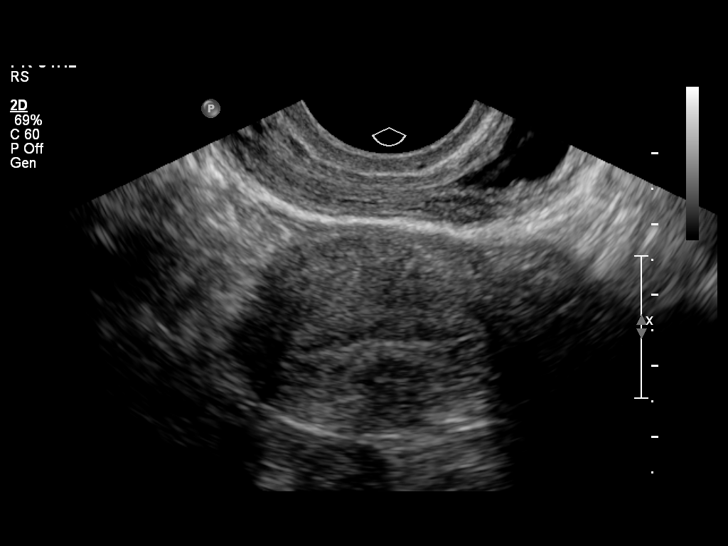
[im 9/25]
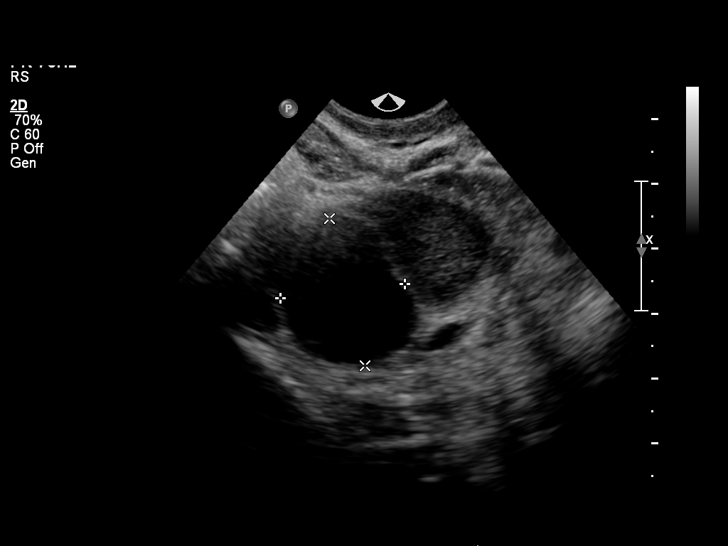
[im 10/25]
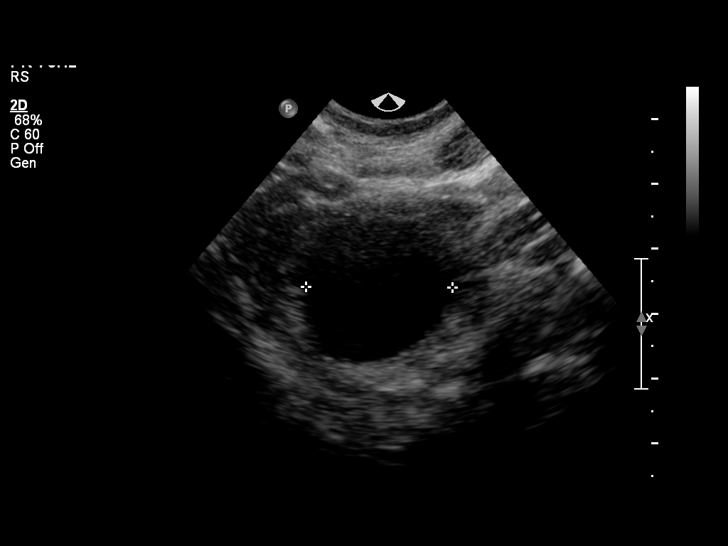
[im 12/25]
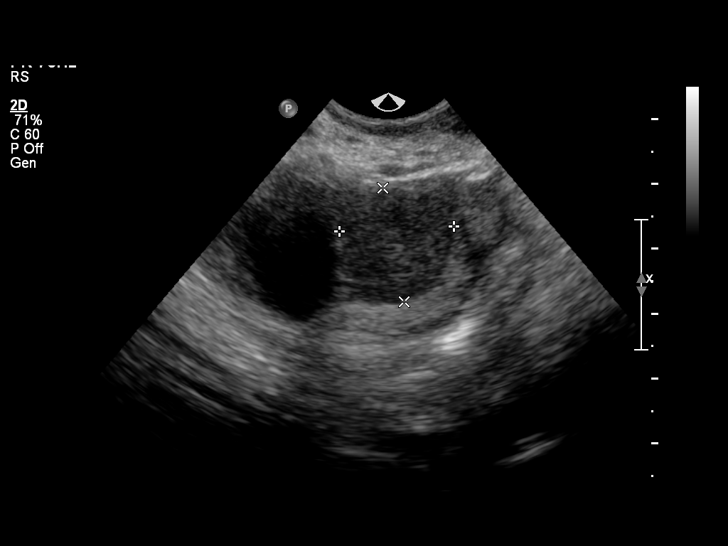
[im 13/25]
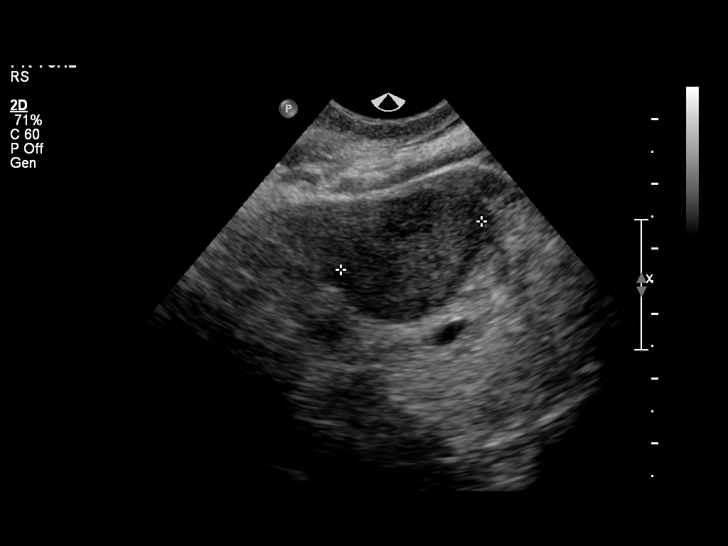
[im 15/25]
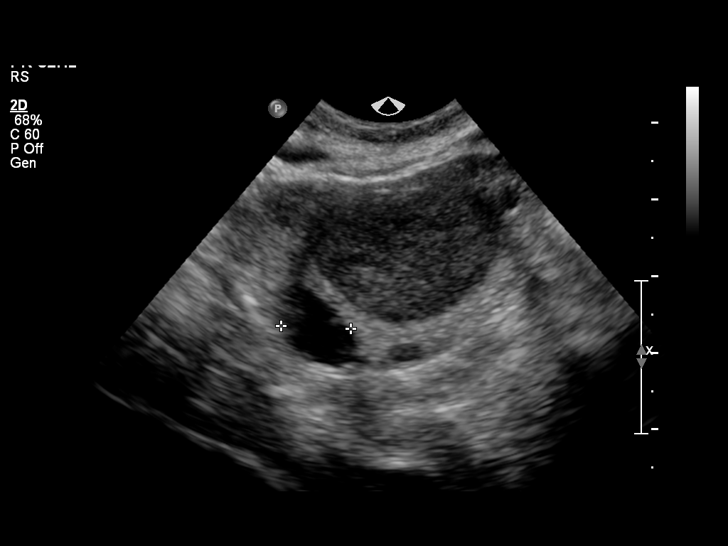
[im 17/25]
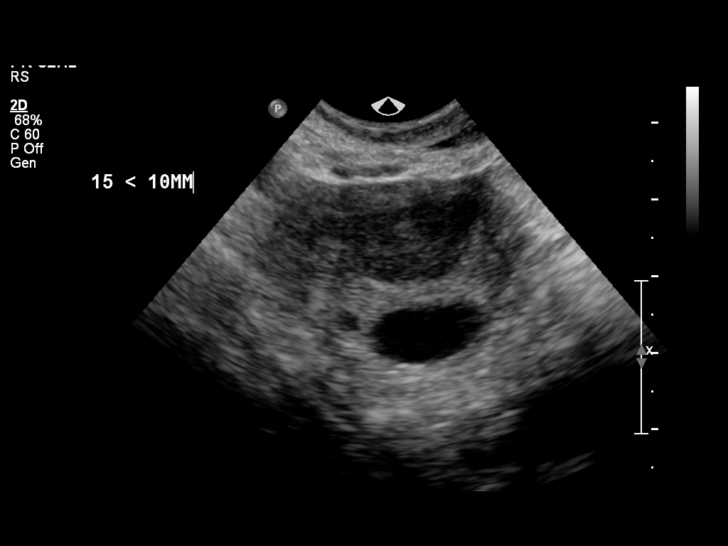
[im 20/25]
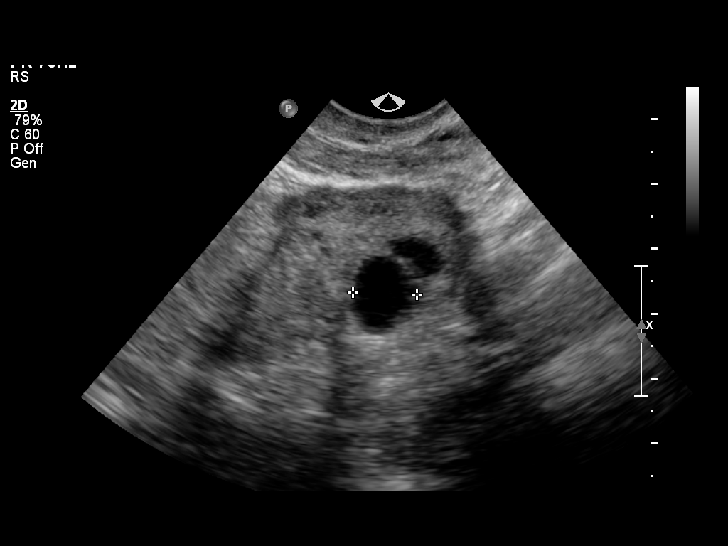
[im 21/25]
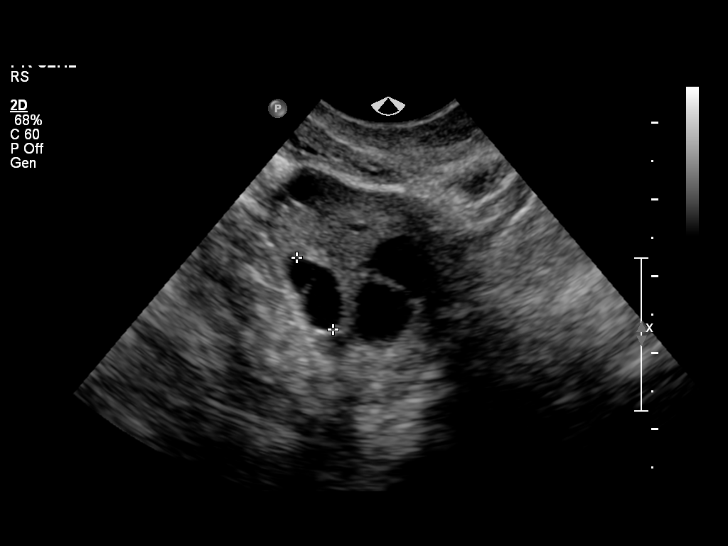
[im 23/25]
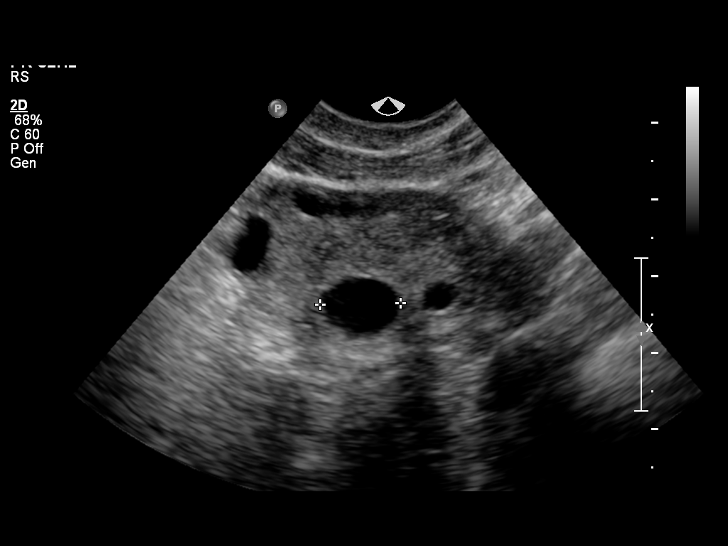
[im 25/25]
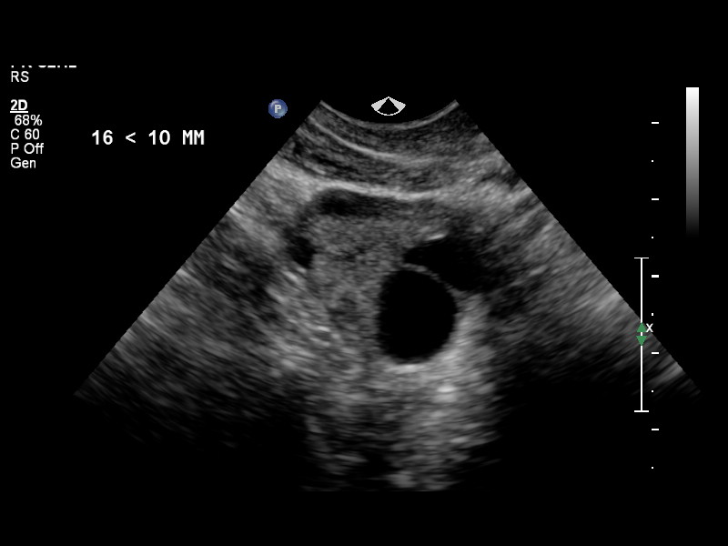

[14 of 16 positions shown; findings below may reference images not displayed]

FINDINGS: The endometrium is tri-layered measuring 10.4 mm.

Right ovary:
Follicles :
#1 - 19.3 x 23.3 x 22.6 mm
#2 - 13.7 x 8.2 x 9.1 mm
Total follicles less than 10 mm - 15.

A 17.7 x 17.8 x 23 mm complex cyst is also noted.

Left ovary:
Follicles:
#1 - 12.5 x 9.2 x 9.9 mm.
#2 - 10.5 x 5.7 x 10.5 mm.
Total follicles less than 10 mm - 16

There is no evidence of free fluid.
IMPRESSION: Follicle study as described.

## 2013-11-22 ENCOUNTER — Encounter (HOSPITAL_COMMUNITY): Payer: Self-pay

## 2014-01-07 LAB — OB RESULTS CONSOLE ANTIBODY SCREEN: Antibody Screen: NEGATIVE

## 2014-01-07 LAB — OB RESULTS CONSOLE HEPATITIS B SURFACE ANTIGEN: Hepatitis B Surface Ag: NEGATIVE

## 2014-01-07 LAB — OB RESULTS CONSOLE ABO/RH: RH Type: POSITIVE

## 2014-01-07 LAB — OB RESULTS CONSOLE HIV ANTIBODY (ROUTINE TESTING): HIV: NONREACTIVE

## 2014-01-07 LAB — OB RESULTS CONSOLE GC/CHLAMYDIA
Chlamydia: NEGATIVE
Gonorrhea: NEGATIVE

## 2014-01-07 LAB — OB RESULTS CONSOLE RUBELLA ANTIBODY, IGM: RUBELLA: IMMUNE

## 2014-01-07 LAB — OB RESULTS CONSOLE RPR: RPR: NONREACTIVE

## 2014-01-21 NOTE — L&D Delivery Note (Signed)
Delivery Note Pt presented to MAU actively pushing. At 9:46 AM a healthy female was delivered via  (Presentation: OA).  APGAR:8 ,10 ; weight pending .   Placenta status:  Delivered spontaneously .  Cord:  with the following complications: none .   Anesthesia:  Local lidocaiine Episiotomy:  none Lacerations: second degree   Suture Repair: 3.0 vicryl Est. Blood Loss (mL):  200cc  Mom to postpartum.  Baby to Couplet care / Skin to Skin.  Terri Zavala,Terri Zavala W 08/05/2014, 10:34 AM

## 2014-07-06 LAB — OB RESULTS CONSOLE GBS: GBS: NEGATIVE

## 2014-08-03 ENCOUNTER — Telehealth (HOSPITAL_COMMUNITY): Payer: Self-pay | Admitting: *Deleted

## 2014-08-03 ENCOUNTER — Encounter (HOSPITAL_COMMUNITY): Payer: Self-pay | Admitting: *Deleted

## 2014-08-03 NOTE — Telephone Encounter (Signed)
Preadmission screen  

## 2014-08-05 ENCOUNTER — Encounter (HOSPITAL_COMMUNITY): Payer: Self-pay | Admitting: *Deleted

## 2014-08-05 ENCOUNTER — Inpatient Hospital Stay (HOSPITAL_COMMUNITY)
Admission: AD | Admit: 2014-08-05 | Discharge: 2014-08-07 | DRG: 775 | Disposition: A | Discharge status: Home / Self Care | Payer: BLUE CROSS/BLUE SHIELD | Source: Ambulatory Visit | Attending: Obstetrics and Gynecology | Admitting: Obstetrics and Gynecology

## 2014-08-05 DIAGNOSIS — Z87442 Personal history of urinary calculi: Secondary | ICD-10-CM | POA: Diagnosis not present

## 2014-08-05 DIAGNOSIS — O34593 Maternal care for other abnormalities of gravid uterus, third trimester: Secondary | ICD-10-CM | POA: Diagnosis present

## 2014-08-05 DIAGNOSIS — Z3A40 40 weeks gestation of pregnancy: Secondary | ICD-10-CM | POA: Diagnosis present

## 2014-08-05 DIAGNOSIS — Q51810 Arcuate uterus: Secondary | ICD-10-CM | POA: Diagnosis not present

## 2014-08-05 DIAGNOSIS — Z8249 Family history of ischemic heart disease and other diseases of the circulatory system: Secondary | ICD-10-CM | POA: Diagnosis not present

## 2014-08-05 LAB — TYPE AND SCREEN
ABO/RH(D): O POS
Antibody Screen: NEGATIVE

## 2014-08-05 MED ORDER — WITCH HAZEL-GLYCERIN EX PADS: 1.0000 "application " | MEDICATED_PAD | CUTANEOUS | Status: DC | PRN

## 2014-08-05 MED ORDER — OXYCODONE-ACETAMINOPHEN 5-325 MG PO TABS: 2.0000 | ORAL_TABLET | ORAL | Status: DC | PRN

## 2014-08-05 MED ORDER — ONDANSETRON HCL 4 MG/2ML IJ SOLN: 4.0000 mg | INTRAMUSCULAR | Status: DC | PRN

## 2014-08-05 MED ORDER — OXYTOCIN 10 UNIT/ML IJ SOLN
INTRAMUSCULAR | Status: AC
Start: 2014-08-05 — End: 2014-08-05
  Administered 2014-08-05: 10 [IU]
  Filled 2014-08-05: qty 1

## 2014-08-05 MED ORDER — DIBUCAINE 1 % RE OINT: 1.0000 "application " | TOPICAL_OINTMENT | RECTAL | Status: DC | PRN

## 2014-08-05 MED ORDER — LIDOCAINE HCL (PF) 1 % IJ SOLN
INTRAMUSCULAR | Status: AC
  Administered 2014-08-05: 30 mL
  Filled 2014-08-05: qty 30

## 2014-08-05 MED ORDER — PRENATAL MULTIVITAMIN CH
1.0000 | ORAL_TABLET | Freq: Every day | ORAL | Status: DC
  Filled 2014-08-05 (×2): qty 1

## 2014-08-05 MED ORDER — SENNOSIDES-DOCUSATE SODIUM 8.6-50 MG PO TABS
2.0000 | ORAL_TABLET | ORAL | Status: DC
  Administered 2014-08-06 (×2): 2 via ORAL
  Filled 2014-08-05 (×2): qty 2

## 2014-08-05 MED ORDER — TETANUS-DIPHTH-ACELL PERTUSSIS 5-2.5-18.5 LF-MCG/0.5 IM SUSP: 0.5000 mL | Freq: Once | INTRAMUSCULAR | Status: DC

## 2014-08-05 MED ORDER — ACETAMINOPHEN 325 MG PO TABS
650.0000 mg | ORAL_TABLET | ORAL | Status: DC | PRN
  Administered 2014-08-05 – 2014-08-06 (×2): 650 mg via ORAL
  Filled 2014-08-05 (×2): qty 2

## 2014-08-05 MED ORDER — ZOLPIDEM TARTRATE 5 MG PO TABS: 5.0000 mg | ORAL_TABLET | Freq: Every evening | ORAL | Status: DC | PRN

## 2014-08-05 MED ORDER — OXYCODONE-ACETAMINOPHEN 5-325 MG PO TABS: 1.0000 | ORAL_TABLET | ORAL | Status: DC | PRN

## 2014-08-05 MED ORDER — BENZOCAINE-MENTHOL 20-0.5 % EX AERO
1.0000 "application " | INHALATION_SPRAY | CUTANEOUS | Status: DC | PRN
  Administered 2014-08-05: 1 via TOPICAL
  Filled 2014-08-05: qty 56

## 2014-08-05 MED ORDER — LANOLIN HYDROUS EX OINT: TOPICAL_OINTMENT | CUTANEOUS | Status: DC | PRN

## 2014-08-05 MED ORDER — OXYTOCIN 10 UNIT/ML IJ SOLN: 10.0000 [IU] | Freq: Once | INTRAMUSCULAR | Status: DC

## 2014-08-05 MED ORDER — SIMETHICONE 80 MG PO CHEW: 80.0000 mg | CHEWABLE_TABLET | ORAL | Status: DC | PRN

## 2014-08-05 MED ORDER — DIPHENHYDRAMINE HCL 25 MG PO CAPS: 25.0000 mg | ORAL_CAPSULE | Freq: Four times a day (QID) | ORAL | Status: DC | PRN

## 2014-08-05 MED ORDER — ONDANSETRON HCL 4 MG PO TABS: 4.0000 mg | ORAL_TABLET | ORAL | Status: DC | PRN

## 2014-08-05 MED ORDER — IBUPROFEN 600 MG PO TABS
600.0000 mg | ORAL_TABLET | Freq: Four times a day (QID) | ORAL | Status: DC
  Administered 2014-08-05 – 2014-08-07 (×8): 600 mg via ORAL
  Filled 2014-08-05 (×8): qty 1

## 2014-08-05 NOTE — MAU Note (Signed)
Delivered a live female infant at [id number]. Apgars 8 at 0947 and 10 at 0951. Infant voided at 0948. Placenta delivered at [phone].

## 2014-08-05 NOTE — MAU Note (Signed)
Terri Zavala, RROB called to assist with delivery.

## 2014-08-05 NOTE — Progress Notes (Signed)
Pt brought from lobby to rm via wc. Not wanting to sit, pt screaming baby is coming.  doula present in lobby, was waiting on pt to arrive.  Husband parking car.

## 2014-08-05 NOTE — MAU Note (Signed)
Dr. Richardson at bedside.

## 2014-08-05 NOTE — Lactation Note (Signed)
This note was copied from the chart of Terri Mora BellmanRenee Ryce. Lactation Consultation Note Initial visit at 12 hours of age.  Mom reports good feedings and denies pain or concerns.  Baby is laying in moms arms asleep.  [facility] LC resources given and discussed.  Encouraged to feed with early cues on demand.  Early newborn behavior discussed.  Hand expression reported with colostrum visible. Hand pump given to mom for home use per her requests.  Mom is aware of how to use.  Mom to call for assist as needed.     Patient Name: Terri Mora BellmanRenee Saravia YQMVH'QToday's Date: 08/05/2014 Reason for consult: Initial assessment   Maternal Data Has patient been taught Hand Expression?: Yes Does the patient have breastfeeding experience prior to this delivery?: Yes  Feeding    LATCH Score/Interventions                Intervention(s): Breastfeeding basics reviewed     Lactation Tools Discussed/Used     Consult Status Consult Status: Follow-up Date: 08/06/14    Jannifer RodneyShoptaw, Dacari Beckstrand Lynn 08/05/2014, 10:45 PM

## 2014-08-05 NOTE — H&P (Signed)
Terri BellmanRenee Zavala is a 29 y.o. female G2P1001 at 140 1/7 weeks (EDD 08/04/14 by 10 week US) presented to MAU completely dilated and pushing.  She proceeded to have a precipitous delivery with no complications (see note).  Prenatal care relatively uncomplicated.  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  No other issues.  Maternal Medical History:  Reason for admission: Contractions.   Contractions: Frequency: rare.   Perceived severity is strong.    Fetal activity: Perceived fetal activity is normal.    Prenatal Complications - Diabetes: none.    OB History    Gravida Para Term Preterm AB TAB SAB Ectopic Multiple Living   2 1 1       1     03/2013 NSVD 7#1oz  Past Medical History  Diagnosis Date  . Headache(784.0)     migraines  . Ganglion cyst of wrist 10/2011    right  . Vaginal Pap smear, abnormal   . Kidney stone   . Hx of endometriosis   . Uterus arcuatus    Past Surgical History  Procedure Laterality Date  . Laparoscopic cholecystectomy    . Carpal tunnel release      right  . Ganglion cyst excision  11/28/2011    Procedure: REMOVAL GANGLION OF WRIST;  Surgeon: Wyn Forsterobert V Sypher Jr., MD;  Location: [facility];  Service: Orthopedics;  Laterality: Right;  CYST EXCISION RIGHT WRIST  . Leep      x2  . Colposcopy     Family History: family history includes Hypertension in her mother; Hypothyroidism in her mother. There is no history of Hearing loss. Social History:  reports that she has never smoked. She has never used smokeless tobacco. She reports that she drinks alcohol. She reports that she does not use illicit drugs.   Prenatal Transfer Tool  Maternal Diabetes: No Genetic Screening: Normal Maternal Ultrasounds/Referrals: Normal Fetal Ultrasounds or other Referrals:  None Maternal Substance Abuse:  No Significant Maternal Medications:  None Significant Maternal Lab Results:  None Other Comments:  None  Review of Systems  Gastrointestinal:  Positive for abdominal pain.    Dilation: 10 Effacement (%): 100 Station: Crowning Exam by:: Dellie BurnsScarlett Murray, RN BSN Blood pressure 127/79, pulse 79, temperature 98.1 F (36.7 C), temperature source Oral, resp. rate 20, height 5\' 5"  (1.651 m), weight 85.276 kg (188 lb), last menstrual period 10/19/2013, unknown if currently breastfeeding. Maternal Exam:  Uterine Assessment: Contraction strength is firm.  Contraction frequency is regular.   Abdomen: Fetal presentation: vertex  Introitus: Normal vulva. Normal vagina.    Physical Exam  Constitutional: She appears well-developed and well-nourished.  Cardiovascular: Normal rate and regular rhythm.   Respiratory: Effort normal.  GI: Soft.  Genitourinary: Vagina normal.  Neurological: She is alert.  Psychiatric: She has a normal mood and affect. Her behavior is normal.    Prenatal labs: ABO, Rh: O/Positive/-- (12/18 0000) Antibody: Negative (12/18 0000) Rubella: Immune (12/18 0000) RPR: Nonreactive (12/18 0000)  HBsAg: Negative (12/18 0000)  HIV: Non-reactive (12/18 0000)  GBS: Negative (06/15 0000)  One hour GTT 26 First trimester screen negative  Assessment/Plan: Pt admitted actively delivering, see delivery note.   Oliver PilaICHARDSON,Nester Bachus W 08/05/2014, 10:30 AM

## 2014-08-05 NOTE — MAU Note (Signed)
Dr. Senaida Oresichardson at bedside for delivery.

## 2014-08-05 NOTE — Progress Notes (Signed)
Notified pt in MAU in active labor, coming now

## 2014-08-06 LAB — CBC
HEMATOCRIT: 32.2 % — AB (ref 36.0–46.0)
Hemoglobin: 10.6 g/dL — ABNORMAL LOW (ref 12.0–15.0)
MCH: 29.4 pg (ref 26.0–34.0)
MCHC: 32.9 g/dL (ref 30.0–36.0)
MCV: 89.2 fL (ref 78.0–100.0)
Platelets: 210 10*3/uL (ref 150–400)
RBC: 3.61 MIL/uL — ABNORMAL LOW (ref 3.87–5.11)
RDW: 13.9 % (ref 11.5–15.5)
WBC: 14.2 10*3/uL — AB (ref 4.0–10.5)

## 2014-08-06 LAB — RPR: RPR Ser Ql: NONREACTIVE

## 2014-08-06 NOTE — Progress Notes (Signed)
Post Partum Day 1 Subjective: no complaints and tolerating PO  Objective: Blood pressure 126/77, pulse 54, temperature 98.5 F (36.9 C), temperature source Oral, resp. rate 20, height 5\' 5"  (1.651 m), weight 85.276 kg (188 lb), last menstrual period 10/19/2013, unknown if currently breastfeeding.  Physical Exam:  General: alert and cooperative Lochia: appropriate Uterine Fundus: firm    Recent Labs  08/06/14 0540  HGB 10.6*  HCT 32.2*    Assessment/Plan: Plan for discharge tomorrow   LOS: 1 day   Chantell Kunkler W 08/06/2014, 9:34 AM

## 2014-08-07 MED ORDER — IBUPROFEN 600 MG PO TABS
600.0000 mg | ORAL_TABLET | Freq: Four times a day (QID) | ORAL | Status: DC
Start: 2014-08-07 — End: 2016-05-26

## 2014-08-07 NOTE — Discharge Summary (Signed)
Obstetric Discharge Summary Reason for Admission: onset of labor Prenatal Procedures: none Intrapartum Procedures: spontaneous vaginal delivery- precip Postpartum Procedures: none Complications-Operative and Postpartum: 2 degree perineal laceration HEMOGLOBIN  Date Value Ref Range Status  08/06/2014 10.6* 12.0 - 15.0 g/dL Final   HCT  Date Value Ref Range Status  08/06/2014 32.2* 36.0 - 46.0 % Final    Physical Exam:  General: alert and cooperative Lochia: appropriate Uterine Fundus: firm  Discharge Diagnoses: Term Pregnancy-delivered  Discharge Information: Date: 08/07/2014 Activity: pelvic rest Diet: routine Medications: Ibuprofen Condition: stable Instructions: refer to practice specific booklet Discharge to: home Follow-up Information    Follow up with Terri PilaICHARDSON,Vedh Ptacek W, MD. Schedule an appointment as soon as possible for a visit in 6 weeks.   Specialty:  Obstetrics and Gynecology   Why:  For postpartum visit   Contact information:   [address] [place] [address] [phone]       Newborn Data: Live born female  Birth Weight: 8 lb 1.3 oz (3665 g) APGAR: 8, 10  Home with mother.  Terri Zavala,Sharian Delia W 08/07/2014, 9:49 AM
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
## 2014-08-07 NOTE — Progress Notes (Signed)
Post Partum Day 2 Subjective: no complaints and tolerating PO  Objective: Blood pressure 95/44, pulse 55, temperature 98.2 F (36.8 C), temperature source Oral, resp. rate 20, height 5\' 5"  (1.651 m), weight 85.276 kg (188 lb), last menstrual period 10/19/2013, unknown if currently breastfeeding.  Physical Exam:  General: alert and cooperative Lochia: appropriate Uterine Fundus: firm    Recent Labs  08/06/14 0540  HGB 10.6*  HCT 32.2*    Assessment/Plan: Discharge home   LOS: 2 days   Terri Zavala W 08/07/2014, 9:42 AM

## 2014-08-11 ENCOUNTER — Inpatient Hospital Stay (HOSPITAL_COMMUNITY): Admission: RE | Admit: 2014-08-11 | Payer: BC Managed Care – PPO | Source: Ambulatory Visit

## 2016-01-22 NOTE — L&D Delivery Note (Signed)
Delivery Note Pt labored quickly to complete; uncontrollable urge to push. She pushed 3-4 times and at 1:46 PM a viable female was delivered via Vaginal, Spontaneous Delivery (Presentation:OA ;  ).  APGAR: , 9, 9; weight pending.   Placenta status: delivered spontaneously; shultz , .  Cord:3vc  with the following complications:none .  Cord pH:n/a Cord segment and blood collected for private banking  Anesthesia:  Epidural Episiotomy: None Lacerations: 2nd degree;Perineal Suture Repair: 2.0 vicryl 4-0 Est. Blood Loss (mL): 250  Mom to postpartum.  Baby to Couplet care / Skin to Skin.  Terri MusterCecilia Zavala Terri Zavala Para 05/26/2016, 2:22 PM

## 2016-03-27 DIAGNOSIS — Z23 Encounter for immunization: Secondary | ICD-10-CM | POA: Diagnosis not present

## 2016-05-26 ENCOUNTER — Inpatient Hospital Stay (HOSPITAL_COMMUNITY)
Admission: AD | Admit: 2016-05-26 | Discharge: 2016-05-27 | DRG: 775 | Disposition: A | Discharge status: Home / Self Care | Payer: 59 | Source: Ambulatory Visit | Attending: Obstetrics and Gynecology | Admitting: Obstetrics and Gynecology

## 2016-05-26 ENCOUNTER — Inpatient Hospital Stay (HOSPITAL_COMMUNITY): Payer: 59 | Admitting: Anesthesiology

## 2016-05-26 ENCOUNTER — Encounter (HOSPITAL_COMMUNITY): Payer: Self-pay | Admitting: *Deleted

## 2016-05-26 DIAGNOSIS — Z3493 Encounter for supervision of normal pregnancy, unspecified, third trimester: Secondary | ICD-10-CM | POA: Diagnosis not present

## 2016-05-26 DIAGNOSIS — Z3A37 37 weeks gestation of pregnancy: Secondary | ICD-10-CM

## 2016-05-26 DIAGNOSIS — Z8249 Family history of ischemic heart disease and other diseases of the circulatory system: Secondary | ICD-10-CM | POA: Diagnosis not present

## 2016-05-26 LAB — CBC
HEMATOCRIT: 30 % — AB (ref 36.0–46.0)
Hemoglobin: 9.7 g/dL — ABNORMAL LOW (ref 12.0–15.0)
MCH: 26.1 pg (ref 26.0–34.0)
MCHC: 32.3 g/dL (ref 30.0–36.0)
MCV: 80.6 fL (ref 78.0–100.0)
PLATELETS: 299 10*3/uL (ref 150–400)
RBC: 3.72 MIL/uL — AB (ref 3.87–5.11)
RDW: 14.5 % (ref 11.5–15.5)
WBC: 13.6 10*3/uL — AB (ref 4.0–10.5)

## 2016-05-26 LAB — TYPE AND SCREEN
ABO/RH(D): O POS
ANTIBODY SCREEN: NEGATIVE

## 2016-05-26 MED ORDER — DIPHENHYDRAMINE HCL 50 MG/ML IJ SOLN: 12.5000 mg | INTRAMUSCULAR | Status: DC | PRN

## 2016-05-26 MED ORDER — FENTANYL 2.5 MCG/ML BUPIVACAINE 1/10 % EPIDURAL INFUSION (WH - ANES): 14.0000 mL/h | INTRAMUSCULAR | Status: DC | PRN

## 2016-05-26 MED ORDER — OXYCODONE-ACETAMINOPHEN 5-325 MG PO TABS: 1.0000 | ORAL_TABLET | ORAL | Status: DC | PRN

## 2016-05-26 MED ORDER — SENNOSIDES-DOCUSATE SODIUM 8.6-50 MG PO TABS
2.0000 | ORAL_TABLET | ORAL | Status: DC
  Administered 2016-05-27: 2 via ORAL
  Filled 2016-05-26: qty 2

## 2016-05-26 MED ORDER — ACETAMINOPHEN 325 MG PO TABS: 650.0000 mg | ORAL_TABLET | ORAL | Status: DC | PRN

## 2016-05-26 MED ORDER — ZOLPIDEM TARTRATE 5 MG PO TABS: 5.0000 mg | ORAL_TABLET | Freq: Every evening | ORAL | Status: DC | PRN

## 2016-05-26 MED ORDER — EPHEDRINE 5 MG/ML INJ: 10.0000 mg | INTRAVENOUS | Status: DC | PRN

## 2016-05-26 MED ORDER — LACTATED RINGERS IV SOLN: 500.0000 mL | INTRAVENOUS | Status: DC | PRN

## 2016-05-26 MED ORDER — OXYTOCIN 40 UNITS IN LACTATED RINGERS INFUSION - SIMPLE MED
2.5000 [IU]/h | INTRAVENOUS | Status: DC
  Administered 2016-05-26: 2.5 [IU]/h via INTRAVENOUS
  Filled 2016-05-26: qty 1000

## 2016-05-26 MED ORDER — SOD CITRATE-CITRIC ACID 500-334 MG/5ML PO SOLN: 30.0000 mL | ORAL | Status: DC | PRN

## 2016-05-26 MED ORDER — DIBUCAINE 1 % RE OINT: 1.0000 "application " | TOPICAL_OINTMENT | RECTAL | Status: DC | PRN

## 2016-05-26 MED ORDER — GUAIFENESIN 100 MG/5ML PO SOLN
15.0000 mL | Freq: Four times a day (QID) | ORAL | Status: DC | PRN
  Administered 2016-05-26 – 2016-05-27 (×2): 300 mg via ORAL
  Filled 2016-05-26 (×2): qty 15

## 2016-05-26 MED ORDER — BENZOCAINE-MENTHOL 20-0.5 % EX AERO
1.0000 "application " | INHALATION_SPRAY | CUTANEOUS | Status: DC | PRN
  Administered 2016-05-26: 1 via TOPICAL
  Filled 2016-05-26: qty 56

## 2016-05-26 MED ORDER — DIPHENHYDRAMINE HCL 25 MG PO CAPS: 25.0000 mg | ORAL_CAPSULE | Freq: Four times a day (QID) | ORAL | Status: DC | PRN

## 2016-05-26 MED ORDER — LACTATED RINGERS IV SOLN
INTRAVENOUS | Status: DC
  Administered 2016-05-26: 11:00:00 via INTRAVENOUS

## 2016-05-26 MED ORDER — IBUPROFEN 600 MG PO TABS
600.0000 mg | ORAL_TABLET | Freq: Four times a day (QID) | ORAL | Status: DC
  Administered 2016-05-26 – 2016-05-27 (×4): 600 mg via ORAL
  Filled 2016-05-26 (×4): qty 1

## 2016-05-26 MED ORDER — LIDOCAINE HCL (PF) 1 % IJ SOLN
30.0000 mL | INTRAMUSCULAR | Status: DC | PRN
  Filled 2016-05-26: qty 30

## 2016-05-26 MED ORDER — OXYCODONE-ACETAMINOPHEN 5-325 MG PO TABS: 2.0000 | ORAL_TABLET | ORAL | Status: DC | PRN

## 2016-05-26 MED ORDER — PRENATAL MULTIVITAMIN CH
1.0000 | ORAL_TABLET | Freq: Every day | ORAL | Status: DC
  Administered 2016-05-27: 1 via ORAL
  Filled 2016-05-26: qty 1

## 2016-05-26 MED ORDER — FLEET ENEMA 7-19 GM/118ML RE ENEM: 1.0000 | ENEMA | RECTAL | Status: DC | PRN

## 2016-05-26 MED ORDER — TETANUS-DIPHTH-ACELL PERTUSSIS 5-2.5-18.5 LF-MCG/0.5 IM SUSP: 0.5000 mL | Freq: Once | INTRAMUSCULAR | Status: DC

## 2016-05-26 MED ORDER — FENTANYL 2.5 MCG/ML BUPIVACAINE 1/10 % EPIDURAL INFUSION (WH - ANES)
INTRAMUSCULAR | Status: AC
  Filled 2016-05-26: qty 100

## 2016-05-26 MED ORDER — PHENYLEPHRINE 40 MCG/ML (10ML) SYRINGE FOR IV PUSH (FOR BLOOD PRESSURE SUPPORT): 80.0000 ug | PREFILLED_SYRINGE | INTRAVENOUS | Status: DC | PRN

## 2016-05-26 MED ORDER — PHENYLEPHRINE 40 MCG/ML (10ML) SYRINGE FOR IV PUSH (FOR BLOOD PRESSURE SUPPORT)
80.0000 ug | PREFILLED_SYRINGE | INTRAVENOUS | Status: DC | PRN
  Filled 2016-05-26: qty 5

## 2016-05-26 MED ORDER — FENTANYL 2.5 MCG/ML BUPIVACAINE 1/10 % EPIDURAL INFUSION (WH - ANES)
14.0000 mL/h | INTRAMUSCULAR | Status: DC | PRN
  Administered 2016-05-26: 14 mL/h via EPIDURAL

## 2016-05-26 MED ORDER — EPHEDRINE 5 MG/ML INJ
10.0000 mg | INTRAVENOUS | Status: DC | PRN
Start: 2016-05-26 — End: 2016-05-26

## 2016-05-26 MED ORDER — OXYTOCIN BOLUS FROM INFUSION
500.0000 mL | Freq: Once | INTRAVENOUS | Status: AC
Start: 2016-05-26 — End: 2016-05-26
  Administered 2016-05-26: 500 mL via INTRAVENOUS

## 2016-05-26 MED ORDER — SIMETHICONE 80 MG PO CHEW: 80.0000 mg | CHEWABLE_TABLET | ORAL | Status: DC | PRN

## 2016-05-26 MED ORDER — WITCH HAZEL-GLYCERIN EX PADS: 1.0000 "application " | MEDICATED_PAD | CUTANEOUS | Status: DC | PRN

## 2016-05-26 MED ORDER — COCONUT OIL OIL: 1.0000 "application " | TOPICAL_OIL | Status: DC | PRN

## 2016-05-26 MED ORDER — LACTATED RINGERS IV SOLN
500.0000 mL | Freq: Once | INTRAVENOUS | Status: AC
  Administered 2016-05-26: 500 mL via INTRAVENOUS

## 2016-05-26 MED ORDER — EPHEDRINE 5 MG/ML INJ
10.0000 mg | INTRAVENOUS | Status: DC | PRN
Start: 2016-05-26 — End: 2016-05-26
  Filled 2016-05-26: qty 2

## 2016-05-26 MED ORDER — LACTATED RINGERS IV SOLN: 500.0000 mL | Freq: Once | INTRAVENOUS | Status: DC

## 2016-05-26 MED ORDER — LIDOCAINE HCL (PF) 1 % IJ SOLN
INTRAMUSCULAR | Status: DC | PRN
  Administered 2016-05-26 (×2): 6 mL via EPIDURAL

## 2016-05-26 MED ORDER — PHENYLEPHRINE 40 MCG/ML (10ML) SYRINGE FOR IV PUSH (FOR BLOOD PRESSURE SUPPORT)
PREFILLED_SYRINGE | INTRAVENOUS | Status: AC
  Filled 2016-05-26: qty 10

## 2016-05-26 MED ORDER — ONDANSETRON HCL 4 MG/2ML IJ SOLN: 4.0000 mg | Freq: Four times a day (QID) | INTRAMUSCULAR | Status: DC | PRN

## 2016-05-26 MED ORDER — ONDANSETRON HCL 4 MG PO TABS: 4.0000 mg | ORAL_TABLET | ORAL | Status: DC | PRN

## 2016-05-26 MED ORDER — ONDANSETRON HCL 4 MG/2ML IJ SOLN: 4.0000 mg | INTRAMUSCULAR | Status: DC | PRN

## 2016-05-26 MED ORDER — EPHEDRINE 5 MG/ML INJ
10.0000 mg | INTRAVENOUS | Status: DC | PRN
  Filled 2016-05-26: qty 2

## 2016-05-26 NOTE — Anesthesia Procedure Notes (Signed)
Epidural Patient location during procedure: OB Start time: 05/26/2016 11:41 AM End time: 05/26/2016 12:02 PM  Staffing Anesthesiologist: Jairo BenJACKSON, Daeveon Zweber Performed: anesthesiologist   Preanesthetic Checklist Completed: patient identified, site marked, surgical consent, pre-op evaluation, timeout performed, IV checked, risks and benefits discussed and monitors and equipment checked  Epidural Patient position: sitting Prep: site prepped and draped and DuraPrep Patient monitoring: blood pressure, continuous pulse ox and heart rate Approach: midline Location: L3-L4 Injection technique: LOR air  Needle:  Needle type: Tuohy  Needle gauge: 17 G Needle length: 9 cm Needle insertion depth: 4.5 cm Catheter type: closed end flexible Catheter size: 19 Gauge Catheter at skin depth: 9.5 cm Test dose: negative (1% lidocaine)  Assessment Events: blood not aspirated, injection not painful, no injection resistance, negative IV test and paresthesia (transient to hip, cath pulled back and resolved)  Additional Notes Pt identified in Labor room.  Monitors applied. Working IV access confirmed. Sterile prep, drape lumbar spine.  1% lido local L 3,4.  #17ga Touhy LOR air at 4.5 cm L 3,4, cath in easily, transient paresthesia to hip, pulled back and resolved, 9.5 cm skin. Test dose OK, cath dosed and infusion begun.  Patient asymptomatic, VSS, no heme aspirated, tolerated well.  Terri Zavala, MDReason for block:procedure for pain

## 2016-05-26 NOTE — Anesthesia Postprocedure Evaluation (Signed)
Anesthesia Post Note  Patient: Terri BellmanRenee Zavala  Procedure(s) Performed: * No procedures listed *  Patient location during evaluation: L&D Anesthesia Type: Epidural Level of consciousness: awake and alert, oriented and patient cooperative Pain management: pain level controlled Vital Signs Assessment: post-procedure vital signs reviewed and stable Respiratory status: spontaneous breathing, nonlabored ventilation and respiratory function stable Cardiovascular status: blood pressure returned to baseline and stable Postop Assessment: no headache, epidural receding, patient able to bend at knees and no signs of nausea or vomiting Anesthetic complications: no        Last Vitals:  Vitals:   05/26/16 1501 05/26/16 1516  BP: (!) 105/35 118/72  Pulse: 75 72  Resp: 18   Temp:      Last Pain:  Vitals:   05/26/16 1210  TempSrc: Oral  PainSc:    Pain Goal:                 [name],E. Matin Mattioli

## 2016-05-26 NOTE — Anesthesia Preprocedure Evaluation (Signed)
Anesthesia Evaluation  Patient identified by MRN, date of birth, ID band Patient awake    Reviewed: Allergy & Precautions, NPO status , Patient's Chart, lab work & pertinent test results  History of Anesthesia Complications Negative for: history of anesthetic complications  Airway Mallampati: II  TM Distance: >3 FB Neck ROM: Full    Dental  (+) Dental Advisory Given   Pulmonary neg pulmonary ROS,    breath sounds clear to auscultation       Cardiovascular negative cardio ROS   Rhythm:Regular Rate:Normal     Neuro/Psych negative neurological ROS     GI/Hepatic Neg liver ROS, GERD  Medicated and Controlled,  Endo/Other  negative endocrine ROS  Renal/GU negative Renal ROS     Musculoskeletal   Abdominal   Peds  Hematology  (+) Blood dyscrasia (plt 299k, Hb 9.7), ,   Anesthesia Other Findings   Reproductive/Obstetrics (+) Pregnancy                             Anesthesia Physical Anesthesia Plan  ASA: II  Anesthesia Plan: Epidural   Post-op Pain Management:    Induction:   Airway Management Planned: Natural Airway  Additional Equipment:   Intra-op Plan:   Post-operative Plan:   Informed Consent: I have reviewed the patients History and Physical, chart, labs and discussed the procedure including the risks, benefits and alternatives for the proposed anesthesia with the patient or authorized representative who has indicated his/her understanding and acceptance.   Dental advisory given  Plan Discussed with:   Anesthesia Plan Comments: (Patient identified. Risks/Benefits/Options discussed with patient including but not limited to bleeding, infection, nerve damage, paralysis, failed block, incomplete pain control, headache, blood pressure changes, nausea, vomiting, reactions to medication both or allergic, itching and postpartum back pain. Confirmed with bedside nurse the patient's  most recent platelet count. Confirmed with patient that they are not currently taking any anticoagulation, have any bleeding history or any family history of bleeding disorders. Patient expressed understanding and wished to proceed. All questions were answered. )        Anesthesia Quick Evaluation

## 2016-05-26 NOTE — H&P (Signed)
Terri BellmanRenee Zavala is a 31 y.o. female presenting for active labor. Pt reports contractions in past two hours - painful in last hour.  Her prenatal care was benign. She was noted to have a possible right dermoid cyst - plan to follow via US post partum. She had a fall at [facility] but no sequela. Pregnancy is dated by a 7weeks US. She screened negative for panorama. CF neg; GBS neg. RI. She had a false positive RPR screen.   OB History    Gravida Para Term Preterm AB Living   3 2 2     2    SAB TAB Ectopic Multiple Live Births         0 2     Past Medical History:  Diagnosis Date  . Ganglion cyst of wrist 10/2011   right  . Headache(784.0)    migraines  . Hx of endometriosis   . Kidney stone   . Uterus arcuatus   . Vaginal Pap smear, abnormal    Past Surgical History:  Procedure Laterality Date  . CARPAL TUNNEL RELEASE     right  . COLPOSCOPY    . GANGLION CYST EXCISION  11/28/2011   Procedure: REMOVAL GANGLION OF WRIST;  Surgeon: Wyn Forsterobert V Sypher Jr., MD;  Location: [facility];  Service: Orthopedics;  Laterality: Right;  CYST EXCISION RIGHT WRIST  . LAPAROSCOPIC CHOLECYSTECTOMY    . LEEP     x2   Family History: family history includes Hypertension in her mother; Hypothyroidism in her mother. Social History:  reports that she has never smoked. She has never used smokeless tobacco. She reports that she drinks alcohol. She reports that she does not use drugs.     Maternal Diabetes: No Genetic Screening: Normal Maternal Ultrasounds/Referrals: Abnormal:  Findings:   Other:?dermoid cyst Fetal Ultrasounds or other Referrals:  None Maternal Substance Abuse:  No Significant Maternal Medications:  None Significant Maternal Lab Results:  Lab values include: Group B Strep negative Other Comments:  None  ROS History Dilation: 7.5 Effacement (%): 60 Station: -2 Exam by:: Terri GermanJ. Lowe RN Blood pressure (!) 142/72, pulse 76, temperature 97.6 F (36.4 C), temperature source Oral,  resp. rate 20, height 5\' 5"  (1.651 m), weight 190 lb (86.2 kg), unknown if currently breastfeeding. Exam Physical Exam  Prenatal labs: ABO, Rh:   Antibody:   Rubella:   RPR:    HBsAg:    HIV:    GBS:     Assessment/Plan: G3P2002 at 37 6/7wks in active labor GBS neg Epidural for pain relief Anticipate svd Augment if indicated   Terri Zavala 05/26/2016, 11:35 AM

## 2016-05-26 NOTE — MAU Note (Signed)
Pt C/O contractions since 0800, having bloody show, denies LOF.

## 2016-05-27 ENCOUNTER — Encounter (HOSPITAL_COMMUNITY): Payer: Self-pay | Admitting: *Deleted

## 2016-05-27 LAB — CBC
HCT: 23.8 % — ABNORMAL LOW (ref 36.0–46.0)
HEMOGLOBIN: 7.8 g/dL — AB (ref 12.0–15.0)
MCH: 26.7 pg (ref 26.0–34.0)
MCHC: 32.8 g/dL (ref 30.0–36.0)
MCV: 81.5 fL (ref 78.0–100.0)
Platelets: 261 10*3/uL (ref 150–400)
RBC: 2.92 MIL/uL — AB (ref 3.87–5.11)
RDW: 14.7 % (ref 11.5–15.5)
WBC: 13.5 10*3/uL — AB (ref 4.0–10.5)

## 2016-05-27 LAB — RPR: RPR Ser Ql: NONREACTIVE

## 2016-05-27 MED ORDER — IBUPROFEN 800 MG PO TABS: 800.0000 mg | ORAL_TABLET | Freq: Four times a day (QID) | ORAL | 1 refills | Status: DC

## 2016-05-27 MED ORDER — PRENATE PIXIE 10-0.6-0.4-200 MG PO CAPS: 1.0000 | ORAL_CAPSULE | Freq: Every day | ORAL | 3 refills | Status: DC

## 2016-05-27 NOTE — Lactation Note (Signed)
This note was copied from a baby's chart. Lactation Consultation Note experienced BF mom BF her 1st child for 8 months until she got pregnant w/her 2nd child, Bf that child for 13 months.  Mom states BF going well with this baby. Has no questions, no issues Bf her other children. Encouraged to call if needs anything. WH/LC brochure given w/resources, support groups and LC services. Patient Name: Terri Mora BellmanRenee Jarvis [id number]'s Date: 05/27/2016 Reason for consult: Initial assessment   Maternal Data Has patient been taught Hand Expression?: Yes Does the patient have breastfeeding experience prior to this delivery?: Yes  Feeding Feeding Type: Breast Fed Length of feed: 30 min  LATCH Score/Interventions                      Lactation Tools Discussed/Used     Consult Status Consult Status: PRN Date: 05/28/16 Follow-up type: In-patient    Charyl DancerCARVER, Cortasia Screws G 05/27/2016, 6:27 AM

## 2016-05-27 NOTE — Addendum Note (Signed)
Addendum  created 05/27/16 [id number] by Ellene Bloodsaw, Doree Fudgeolleen S, CRNA   Charge Capture section accepted

## 2016-05-27 NOTE — Progress Notes (Addendum)
Post Partum Day 1 Subjective: no complaints, up ad lib, voiding, tolerating PO and nl lochia, pain controlled  Objective: Blood pressure (!) 110/52, pulse (!) 57, temperature 97.9 F (36.6 C), resp. rate 16, height 5\' 5"  (1.651 m), weight 86.2 kg (190 lb), SpO2 99 %, unknown if currently breastfeeding.  Physical Exam:  General: alert and no distress Lochia: appropriate Uterine Fundus: firm   Recent Labs  05/26/16 1115 05/27/16 0518  HGB 9.7* 7.8*  HCT 30.0* 23.8*    Assessment/Plan: Plan for discharge tomorrow.  Routine care.    Pt desires discharge today, if cleared by pediatrician.  Will d/c with motrin and PNV   LOS: 1 day   Fumiko Cham Bovard-Stuckert 05/27/2016, 7:37 AM

## 2016-05-27 NOTE — Discharge Summary (Signed)
OB Discharge Summary     Patient Name: Terri Zavala DOB: [birth year] MRN: [id number]  Date of admission: 05/26/2016 Delivering MD: Pryor OchoaBANGA, CECILIA [facility] IncWOREMA   Date of discharge: 05/27/2016  Admitting diagnosis: 37wks contractions 3-5  Intrauterine pregnancy: 8932w0d     Secondary diagnosis:  Active Problems:   Normal labor   Postpartum care following vaginal delivery  Additional problems: none     Discharge diagnosis: Term Pregnancy Delivered                                                                                                Post partum procedures:none  Augmentation: none  Complications: None  Hospital course:  Onset of Labor With Vaginal Delivery     31 y.o. yo G3P2002 at 8232w0d was admitted in Active Labor on 05/26/2016. Patient had an uncomplicated labor course as follows:  Membrane Rupture Time/Date: 12:41 PM ,05/26/2016   Intrapartum Procedures: Episiotomy: None [1]                                         Lacerations:  2nd degree [3];Perineal [11]  Patient had a delivery of a Viable infant. 05/26/2016  Information for the patient's newborn:  Izora GalaKuzma, Girl Kealy [[id number]][[id number]]       Pateint had an uncomplicated postpartum course.  She is ambulating, tolerating a regular diet, passing flatus, and urinating well. Patient is discharged home in stable condition on 05/27/16.   Physical exam  Vitals:   05/26/16 1600 05/26/16 1700 05/26/16 2115 05/27/16 0500  BP: 121/65 135/72 123/62 (!) 110/52  Pulse: 62 70 63 (!) 57  Resp: 18 20 18 16   Temp: 97.9 F (36.6 C) 97.8 F (36.6 C) 97.9 F (36.6 C)   TempSrc: Oral Oral    SpO2:      Weight:      Height:       General: alert and no distress Lochia: appropriate Uterine Fundus: firm  Labs: Lab Results  Component Value Date   WBC 13.5 (H) 05/27/2016   HGB 7.8 (L) 05/27/2016   HCT 23.8 (L) 05/27/2016   MCV 81.5 05/27/2016   PLT 261 05/27/2016   No flowsheet data found.  Discharge instruction: per After Visit Summary and  "Baby and Me Booklet".  After visit meds:  Allergies as of 05/27/2016      Reactions   Imitrex [sumatriptan] Other (See Comments)   Reaction:  Tachycardia    Latex Hives   Shellfish Allergy Hives   Adhesive [tape] Itching, Rash      Medication List    STOP taking these medications   doxylamine (Sleep) 25 MG tablet Commonly known as:  UNISOM     TAKE these medications   ibuprofen 800 MG tablet Commonly known as:  ADVIL,MOTRIN Take 1 tablet (800 mg total) by mouth every 6 (six) hours.   pantoprazole 40 MG tablet Commonly known as:  PROTONIX Take 40 mg by mouth at bedtime.   PRENATE PIXIE 10-0.6-0.4-200 MG Caps Take 1 capsule by  mouth daily.   sertraline 50 MG tablet Commonly known as:  ZOLOFT Take 50 mg by mouth at bedtime.       Diet: routine diet  Activity: Advance as tolerated. Pelvic rest for 6 weeks.   Outpatient follow up:6 weeks Follow up Appt:No future appointments. Follow up Visit:No Follow-up on file.  Postpartum contraception: Undecided  Newborn Data: Live born female  Birth Weight: 7 lb 15 oz (3600 g) APGAR: , 9  Baby Feeding: Breast Disposition:home with mother   05/27/2016 Sherian Rein, MD

## 2016-05-27 NOTE — Anesthesia Postprocedure Evaluation (Addendum)
Anesthesia Post Note  Patient: Terri Zavala  Procedure(s) Performed: * No procedures listed *  Patient location during evaluation: Mother Baby Anesthesia Type: Epidural Level of consciousness: awake, awake and alert and oriented Pain management: pain level controlled Vital Signs Assessment: post-procedure vital signs reviewed and stable Respiratory status: spontaneous breathing, nonlabored ventilation and respiratory function stable Cardiovascular status: stable Postop Assessment: no headache, no backache, patient able to bend at knees, no signs of nausea or vomiting and adequate PO intake Anesthetic complications: no        Last Vitals:  Vitals:   05/26/16 2115 05/27/16 0500  BP: 123/62 (!) 110/52  Pulse: 63 (!) 57  Resp: 18 16  Temp: 36.6 C     Last Pain:  Vitals:   05/26/16 1700  TempSrc: Oral  PainSc: 1    Pain Goal:                 RHYMER,COLLEEN

## 2016-08-08 NOTE — Addendum Note (Signed)
Addendum  created 08/08/16 1806 by Jairo BenJackson, Mikaylee Arseneau, MD   Sign clinical note

## 2016-10-31 DIAGNOSIS — J069 Acute upper respiratory infection, unspecified: Secondary | ICD-10-CM | POA: Diagnosis not present

## 2016-10-31 DIAGNOSIS — Z23 Encounter for immunization: Secondary | ICD-10-CM | POA: Diagnosis not present

## 2017-10-29 DIAGNOSIS — Z0000 Encounter for general adult medical examination without abnormal findings: Secondary | ICD-10-CM | POA: Diagnosis not present

## 2017-11-10 DIAGNOSIS — Z23 Encounter for immunization: Secondary | ICD-10-CM | POA: Diagnosis not present

## 2017-11-10 DIAGNOSIS — Z6827 Body mass index (BMI) 27.0-27.9, adult: Secondary | ICD-10-CM | POA: Diagnosis not present

## 2017-11-10 DIAGNOSIS — Z0000 Encounter for general adult medical examination without abnormal findings: Secondary | ICD-10-CM | POA: Diagnosis not present

## 2018-05-07 DIAGNOSIS — G4700 Insomnia, unspecified: Secondary | ICD-10-CM | POA: Diagnosis not present

## 2018-05-07 DIAGNOSIS — Z719 Counseling, unspecified: Secondary | ICD-10-CM | POA: Diagnosis not present

## 2018-05-26 DIAGNOSIS — G4700 Insomnia, unspecified: Secondary | ICD-10-CM | POA: Diagnosis not present

## 2023-05-21 ENCOUNTER — Telehealth: Payer: Self-pay | Admitting: Internal Medicine

## 2023-05-21 NOTE — Telephone Encounter (Signed)
 Contacted by Dr. Huntley Mai (husband) - patient with bloody diarrhea and decreased Hg 11.5 to 10  Has severe fatigue, no abdominal pain  Her PCP has placed an urgent referral I am told  Please try to schedule w/ next available APP or MD for evaluation - thanks

## 2023-05-22 NOTE — Telephone Encounter (Signed)
 Terri Zavala can you set this up or do I send to someone else?

## 2023-05-23 ENCOUNTER — Other Ambulatory Visit (INDEPENDENT_AMBULATORY_CARE_PROVIDER_SITE_OTHER)

## 2023-05-23 ENCOUNTER — Ambulatory Visit: Admitting: Gastroenterology

## 2023-05-23 ENCOUNTER — Encounter: Payer: Self-pay | Admitting: Gastroenterology

## 2023-05-23 VITALS — BP 110/64 | HR 67 | Ht 65.0 in | Wt 144.0 lb

## 2023-05-23 DIAGNOSIS — D649 Anemia, unspecified: Secondary | ICD-10-CM

## 2023-05-23 DIAGNOSIS — D509 Iron deficiency anemia, unspecified: Secondary | ICD-10-CM | POA: Diagnosis not present

## 2023-05-23 DIAGNOSIS — K625 Hemorrhage of anus and rectum: Secondary | ICD-10-CM | POA: Diagnosis not present

## 2023-05-23 DIAGNOSIS — E611 Iron deficiency: Secondary | ICD-10-CM | POA: Diagnosis not present

## 2023-05-23 DIAGNOSIS — Z862 Personal history of diseases of the blood and blood-forming organs and certain disorders involving the immune mechanism: Secondary | ICD-10-CM | POA: Diagnosis not present

## 2023-05-23 DIAGNOSIS — R194 Change in bowel habit: Secondary | ICD-10-CM | POA: Diagnosis not present

## 2023-05-23 LAB — VITAMIN B12: Vitamin B-12: 447 pg/mL (ref 211–911)

## 2023-05-23 LAB — FOLATE: Folate: 9.3 ng/mL (ref >5.9)

## 2023-05-23 MED ORDER — NA SULFATE-K SULFATE-MG SULF 17.5-3.13-1.6 GM/177ML PO SOLN: 1.0000 | ORAL | 0 refills | Status: DC

## 2023-05-23 NOTE — Patient Instructions (Signed)
 You have been scheduled for a colonoscopy. Please follow written instructions given to you at your visit today.   If you use inhalers (even only as needed), please bring them with you on the day of your procedure.  DO NOT TAKE 7 DAYS PRIOR TO TEST- Trulicity (dulaglutide) Ozempic, Wegovy (semaglutide) Mounjaro (tirzepatide) Bydureon Bcise (exanatide extended release)  DO NOT TAKE 1 DAY PRIOR TO YOUR TEST Rybelsus (semaglutide) Adlyxin (lixisenatide) Victoza (liraglutide) Byetta (exanatide) ___________________________________________________________________________   We have sent the following medications to your pharmacy for you to pick up at your convenience: Suprep   Your provider has requested that you go to the basement level for lab work before leaving today. Press "B" on the elevator. The lab is located at the first door on the left as you exit the elevator.  Due to recent changes in healthcare laws, you may see the results of your imaging and laboratory studies on MyChart before your provider has had a chance to review them.  We understand that in some cases there may be results that are confusing or concerning to you. Not all laboratory results come back in the same time frame and the provider may be waiting for multiple results in order to interpret others.  Please give us  48 hours in order for your provider to thoroughly review all the results before contacting the office for clarification of your results.   Thank you for choosing me and [facility].  Dr. Brice Campi

## 2023-05-23 NOTE — Progress Notes (Unsigned)
 GASTROENTEROLOGY OUTPATIENT CLINIC VISIT   Primary Care Provider Raymona Caldwell, MD [address] [place] [address] [phone]  Referring Provider Raymona Caldwell, MD [address] [place],  [place] [address] [phone]  Patient Profile: Terri Zavala is a 38 y.o. female with a pmh significant for migraines, nephrolithiasis, reported endometriosis, status post cholecystectomy (gallbladder dyskinesia), hemorrhoids (reported).  The patient presents to the [facility] for an evaluation and management of problem(s) noted below:  Problem List 1. Rectal bleeding   2. Change in bowel habits   3. Iron deficiency   4. History of anemia    Discussed the use of AI scribe software for clinical note transcription with the patient, who gave verbal consent to proceed.  History of Present Illness This is the patient's first visit to the [facility] GI clinic.  She has a history of previous cholecystectomy for gallbladder dyskinesia (on HIDA) years ago.  She reports in 2010/2011 having a workup with [facility] for heartburn issues which led to an EGD that she reports was normal.  She has longer-standing anemia as well and is not on any Iron supplementation.  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  She presents with recent progression of fatigue, joint pains, diarrhea, and rectal bleeding.   She has been experiencing diarrheal movements with changes in her bowel habits for the last 4-5 weeks.  She has semi-formed stools occurring a few times a day.  Normally she has a well-formed bowel movement daily.  She has long-standing urgency, but issues are a bit more occurring now that her stools have become less formed.  She has not had any incontinence.  She has experienced blood after wiping over many years which has been attributed to her hemorrhoids.  However, earlier this week, she had a diarrheal bowel movement with BRB in the toilet bowl, which is new to her completely.  This  was a major reason that she saw her She denies any associated severe abdominal pain (but her lower abdomen feels "tighter than normal".  No nausea or vomiting.  She has not had progressive heartburn symptoms.  3 weeks ago, she had been on the trails and found a tick on her (though did not feel it had necessarily bitten/embedded on her).  She has been experiencing significant fatigue and joint pain, particularly in her fingers and wrists, which are swollen and difficult to bend. All of these symptoms together took her to her PCP, and she was found to have a progressive anemia 11.5 -> 10 (much lower than her reported baseline).  She does have menarche flow for 5-6 days, which has not changed for her in years.  She was also started on doxycycline  this week as well while laboratories were returning for tick-borne illnesses.  She has never had a colonoscopy  GI Review of Systems Positive as above Negative for pyrosis, dysphagia, odynophagia, nausea, vomiting, melena  Review of Systems General: Denies fevers/chills/weight loss unintentionally Cardiovascular: Denies chest pain Pulmonary: Denies shortness of breath Gastroenterological: See HPI Genitourinary: Denies darkened urine Hematological: Denies easy bruising/bleeding Endocrine: Denies temperature intolerance Dermatological: Denies jaundice Psychological: Mood is stable  Medications Current Outpatient Medications  Medication Sig Dispense Refill   Na Sulfate-K Sulfate-Mg Sulfate concentrate (SUPREP BOWEL PREP KIT) 17.5-3.13-1.6 GM/177ML SOLN Take 1 kit (354 mLs total) by mouth as directed. For colonoscopy prep 354 mL 0   sertraline (ZOLOFT) 50 MG tablet Take 100 mg by mouth at bedtime.     traZODone (  DESYREL) 50 MG tablet Take 50 mg by mouth at bedtime.     No current facility-administered medications for this visit.    Allergies Allergies  Allergen Reactions   Imitrex [Sumatriptan] Other (See Comments)    Reaction:  Tachycardia    Latex  Hives   Shellfish Allergy Hives   Adhesive [Tape] Itching and Rash    Histories Past Medical History:  Diagnosis Date   Ganglion cyst of wrist 10/2011   right   Headache(784.0)    migraines   Hx of endometriosis    Kidney stone    Uterus arcuatus    Vaginal Pap smear, abnormal    Past Surgical History:  Procedure Laterality Date   CARPAL TUNNEL RELEASE     right   COLPOSCOPY     GANGLION CYST EXCISION  11/28/2011   Procedure: REMOVAL GANGLION OF WRIST;  Surgeon: Amelie Baize., MD;  Location: [facility];  Service: Orthopedics;  Laterality: Right;  CYST EXCISION RIGHT WRIST   LAPAROSCOPIC CHOLECYSTECTOMY     LEEP     x2   Social History   Socioeconomic History   Marital status: Married    Spouse name: Not on file   Number of children: Not on file   Years of education: Not on file   Highest education level: Not on file  Occupational History   Not on file  Tobacco Use   Smoking status: Never   Smokeless tobacco: Never  Vaping Use   Vaping status: Never Used  Substance and Sexual Activity   Alcohol use: Yes    Comment: occasionally   Drug use: No   Sexual activity: Yes    Birth control/protection: None  Other Topics Concern   Not on file  Social History Narrative   Not on file   Social Drivers of Health   Financial Resource Strain: Not on file  Food Insecurity: Not on file  Transportation Needs: Not on file  Physical Activity: Not on file  Stress: Not on file  Social Connections: Not on file  Intimate Partner Violence: Not on file   Family History  Problem Relation Age of Onset   Hypertension Mother    Hypothyroidism Mother    Hearing loss Neg Hx    Colon cancer Neg Hx    Esophageal cancer Neg Hx    Inflammatory bowel disease Neg Hx    Liver disease Neg Hx    Pancreatic cancer Neg Hx    Rectal cancer Neg Hx    Stomach cancer Neg Hx    I have reviewed her medical, social, and family history in detail and updated the electronic  medical record as necessary.    PHYSICAL EXAMINATION  BP 110/64   Pulse 67   Ht 5\' 5"  (1.651 m)   Wt 144 lb (65.3 kg)   BMI 23.96 kg/m  Wt Readings from Last 3 Encounters:  05/23/23 144 lb (65.3 kg)  05/26/16 190 lb (86.2 kg)  08/05/14 188 lb (85.3 kg)  GEN: NAD, appears stated age, doesn't appear chronically ill PSYCH: Cooperative, without pressured speech EYE: Conjunctivae pink, sclerae anicteric ENT: MMM CV: Nontachycardic RESP: No audible wheezing GI: NABS, soft, NT/ND, without rebound or guarding, no HSM appreciated GU: DRE deferred as she is willing to undergo colonoscopy in the coming weeks MSK/EXT: No significant lower extremity edema SKIN: No jaundice NEURO:  Alert & Oriented x 3, no focal deficits   REVIEW OF DATA  I reviewed the following data at  the time of this encounter:  GI Procedures and Studies  Patient reports prior endoscopy in 2000 10/2009 in [facility] was normal Will try to get records  Laboratory Studies  Reviewed those in epic  PCP lab records as follows Iron 25 TIBC 274 Iron saturation 9% UIBC 249 Ferritin 59 WBC 3.9 Hemoglobin 10 Hematocrit 31.5 Platelets 231 MCV 83 Lyme antibodies negative Sodium 140 Potassium 4.1 BUN/creatinine 7/0.74 AST/ALT 21/14 Alk phos 62 Total bili 0.4 Albumin 4.2 Rheumatoid factor negative ESR 4 CRP 4 ANA pending  Imaging Studies  No relevant studies to review   ASSESSMENT  Ms. Hattabaugh is a 38 y.o. female with a pmh significant for migraines, nephrolithiasis, reported endometriosis, status post cholecystectomy (gallbladder dyskinesia), hemorrhoids (reported).  The patient is seen today for evaluation and management of:  1. Rectal bleeding   2. Change in bowel habits   3. Iron deficiency   4. History of anemia    The patient is hemodynamically stable.  Clinically however she has had a significant change in her bowel habits over the course of the last month.  Within the last week had rectal bleeding  with diarrhea.  No abdominal pain.  Does not sound to be ischemic in nature either.  Underlying infectious etiology seems most likely.  However with the new joint pains and aches with negative tickborne workup, 1 must consider the possibility of an underlying inflammatory bowel disease first presentation.  Her ESR and CRP being normal makes this less likely though not impossible.  We are going to move forward with laboratory workup as well as stool study workup.  We are gena plan a diagnostic colonoscopy.  The risks and benefits of endoscopic evaluation were discussed with the patient; these include but are not limited to the risk of perforation, infection, bleeding, missed lesions, lack of diagnosis, severe illness requiring hospitalization, as well as anesthesia and sedation related illnesses.  The patient and/or family is agreeable to proceed.   All patient questions were answered to the best of my ability, and the patient agrees to the aforementioned plan of action with follow-up as indicated.   PLAN  Laboratories as outlined below Stool studies as outlined below Proceed with scheduling colonoscopy If celiac serologies are abnormal we will need to schedule an upper endoscopy as well Stay hydrated for now and monitor bowel habits I would like patient to initiate an iron supplement, but we will do that after her colonoscopy She may benefit from IV iron infusions in future   Orders Placed This Encounter  Procedures   Calprotectin, Fecal   Stool Culture   Ova and parasite examination   Clostridium difficile Toxin B, Qualitative, Real-Time PCR([facility])   CBC   B12   Folate   IgA   Tissue transglutaminase, IgA   Ambulatory referral to Gastroenterology    New Prescriptions   NA SULFATE-K SULFATE-MG SULFATE CONCENTRATE (SUPREP BOWEL PREP KIT) 17.5-3.13-1.6 GM/177ML SOLN    Take 1 kit (354 mLs total) by mouth as directed. For colonoscopy prep   Modified Medications   No medications on file     Planned Follow Up No follow-ups on file.   Total Time in Face-to-Face and in Coordination of Care for patient including independent/personal interpretation/review of prior testing, medical history, examination, medication adjustment, communicating results with the patient directly, and documentation within the EHR is 45 minutes.   [name], MD [facility] Advanced Endoscopy Office # [phone]

## 2023-05-24 ENCOUNTER — Encounter: Payer: Self-pay | Admitting: Gastroenterology

## 2023-05-24 DIAGNOSIS — K625 Hemorrhage of anus and rectum: Secondary | ICD-10-CM | POA: Insufficient documentation

## 2023-05-24 DIAGNOSIS — D509 Iron deficiency anemia, unspecified: Secondary | ICD-10-CM | POA: Insufficient documentation

## 2023-05-24 DIAGNOSIS — D649 Anemia, unspecified: Secondary | ICD-10-CM | POA: Insufficient documentation

## 2023-05-24 DIAGNOSIS — R194 Change in bowel habit: Secondary | ICD-10-CM | POA: Insufficient documentation

## 2023-05-25 ENCOUNTER — Encounter: Payer: Self-pay | Admitting: Gastroenterology

## 2023-05-25 LAB — CBC
HCT: 30 % — ABNORMAL LOW (ref 36.0–46.0)
Hemoglobin: 9.8 g/dL — ABNORMAL LOW (ref 12.0–15.0)
MCHC: 32.7 g/dL (ref 30.0–36.0)
MCV: 81.4 fl (ref 78.0–100.0)
Platelets: 267 10*3/uL (ref 150.0–400.0)
RBC: 3.69 Mil/uL — ABNORMAL LOW (ref 3.87–5.11)
RDW: 14.8 % (ref 11.5–15.5)
WBC: 5.3 10*3/uL (ref 4.0–10.5)

## 2023-05-26 ENCOUNTER — Encounter: Payer: Self-pay | Admitting: Gastroenterology

## 2023-05-27 ENCOUNTER — Other Ambulatory Visit

## 2023-05-27 ENCOUNTER — Encounter: Payer: Self-pay | Admitting: Gastroenterology

## 2023-05-27 DIAGNOSIS — R194 Change in bowel habit: Secondary | ICD-10-CM

## 2023-05-27 DIAGNOSIS — K625 Hemorrhage of anus and rectum: Secondary | ICD-10-CM

## 2023-05-28 ENCOUNTER — Other Ambulatory Visit

## 2023-05-28 ENCOUNTER — Other Ambulatory Visit: Payer: Self-pay

## 2023-05-28 DIAGNOSIS — K625 Hemorrhage of anus and rectum: Secondary | ICD-10-CM

## 2023-05-28 DIAGNOSIS — R194 Change in bowel habit: Secondary | ICD-10-CM

## 2023-05-28 DIAGNOSIS — E611 Iron deficiency: Secondary | ICD-10-CM

## 2023-05-28 LAB — CLOSTRIDIUM DIFFICILE TOXIN B, QUALITATIVE, REAL-TIME PCR: Toxigenic C. Difficile by PCR: NOT DETECTED

## 2023-05-29 LAB — OVA AND PARASITE EXAMINATION
CONCENTRATE RESULT:: NONE SEEN
MICRO NUMBER:: 16419080
SPECIMEN QUALITY:: ADEQUATE
TRICHROME RESULT:: NONE SEEN

## 2023-05-29 LAB — CLOSTRIDIUM DIFFICILE TOXIN B, QUALITATIVE, REAL-TIME PCR

## 2023-05-29 LAB — CALPROTECTIN

## 2023-05-29 LAB — TISSUE TRANSGLUTAMINASE, IGA: (tTG) Ab, IgA: <1 U/mL

## 2023-05-29 LAB — CALPROTECTIN, FECAL: Calprotectin, Fecal: 10 ug/g (ref 0–120)

## 2023-05-29 LAB — IGA: Immunoglobulin A: 175 mg/dL (ref 47–310)

## 2023-05-31 ENCOUNTER — Encounter: Payer: Self-pay | Admitting: Gastroenterology

## 2023-05-31 LAB — STOOL CULTURE: E coli, Shiga toxin Assay: NEGATIVE

## 2023-06-01 ENCOUNTER — Encounter: Payer: Self-pay | Admitting: Gastroenterology

## 2023-06-03 ENCOUNTER — Ambulatory Visit (AMBULATORY_SURGERY_CENTER): Admitting: Gastroenterology

## 2023-06-03 ENCOUNTER — Encounter: Payer: Self-pay | Admitting: Gastroenterology

## 2023-06-03 VITALS — BP 104/70 | HR 52 | Temp 98.1°F | Resp 21 | Ht 65.0 in | Wt 144.0 lb

## 2023-06-03 DIAGNOSIS — K644 Residual hemorrhoidal skin tags: Secondary | ICD-10-CM | POA: Diagnosis present

## 2023-06-03 DIAGNOSIS — K641 Second degree hemorrhoids: Secondary | ICD-10-CM | POA: Diagnosis not present

## 2023-06-03 DIAGNOSIS — R194 Change in bowel habit: Secondary | ICD-10-CM

## 2023-06-03 DIAGNOSIS — K625 Hemorrhage of anus and rectum: Secondary | ICD-10-CM

## 2023-06-03 MED ORDER — SODIUM CHLORIDE 0.9 % IV SOLN: 500.0000 mL | Freq: Once | INTRAVENOUS | Status: DC

## 2023-06-03 MED ORDER — HYDROCORTISONE ACETATE 25 MG RE SUPP: 25.0000 mg | Freq: Every day | RECTAL | 1 refills | Status: AC | PRN

## 2023-06-03 NOTE — Progress Notes (Signed)
 Called to room to assist during endoscopic procedure.  Patient ID and intended procedure confirmed with present staff. Received instructions for my participation in the procedure from the performing physician.

## 2023-06-03 NOTE — Patient Instructions (Addendum)
 Resume previous diet and medications - Dr. Brice Campi recommends high fiber diet and fiber supplement (see procedure report).  Anusol ordered - use nightly for 3 nights then as needed for hemorrhoids.  Biopsy results will be sent via MyChart or letter with any additional recommendations from Dr. Brice Campi.    YOU HAD AN ENDOSCOPIC PROCEDURE TODAY AT THE [facility]:   Refer to the procedure report that was given to you for any specific questions about what was found during the examination.  If the procedure report does not answer your questions, please call your gastroenterologist to clarify.  If you requested that your care partner not be given the details of your procedure findings, then the procedure report has been included in a sealed envelope for you to review at your convenience later.  YOU SHOULD EXPECT: Some feelings of bloating in the abdomen. Passage of more gas than usual.  Walking can help get rid of the air that was put into your GI tract during the procedure and reduce the bloating. If you had a lower endoscopy (such as a colonoscopy or flexible sigmoidoscopy) you may notice spotting of blood in your stool or on the toilet paper. If you underwent a bowel prep for your procedure, you may not have a normal bowel movement for a few days.  Please Note:  You might notice some irritation and congestion in your nose or some drainage.  This is from the oxygen used during your procedure.  There is no need for concern and it should clear up in a day or so.  SYMPTOMS TO REPORT IMMEDIATELY:  Following lower endoscopy (colonoscopy or flexible sigmoidoscopy):  Excessive amounts of blood in the stool  Significant tenderness or worsening of abdominal pains  Swelling of the abdomen that is new, acute  Fever of 100F or higher  For urgent or emergent issues, a gastroenterologist can be reached at any hour by calling [phone]. Do not use MyChart messaging for urgent concerns.     DIET:  We do recommend a small meal at first, but then you may proceed to your regular diet.  Drink plenty of fluids but you should avoid alcoholic beverages for 24 hours.  ACTIVITY:  You should plan to take it easy for the rest of today and you should NOT DRIVE or use heavy machinery until tomorrow (because of the sedation medicines used during the test).    FOLLOW UP: Our staff will call the number listed on your records the next business day following your procedure.  We will call around 7:15- 8:00 am to check on you and address any questions or concerns that you may have regarding the information given to you following your procedure. If we do not reach you, we will leave a message.     If any biopsies were taken you will be contacted by phone or by letter within the next 1-3 weeks.  Please call us  at [phone] if you have not heard about the biopsies in 3 weeks.    SIGNATURES/CONFIDENTIALITY: You and/or your care partner have signed paperwork which will be entered into your electronic medical record.  These signatures attest to the fact that that the information above on your After Visit Summary has been reviewed and is understood.  Full responsibility of the confidentiality of this discharge information lies with you and/or your care-partner.

## 2023-06-03 NOTE — Progress Notes (Signed)
 Pt's states no medical or surgical changes since previsit or office visit.

## 2023-06-03 NOTE — Progress Notes (Signed)
 Vss nad trans to pacu

## 2023-06-03 NOTE — Progress Notes (Signed)
 GASTROENTEROLOGY PROCEDURE H&P NOTE   Primary Care Physician: Raymona Caldwell, MD  HPI: Pressley Zavala is a 38 y.o. female who presents for Colonoscopy for change in bowel habits and rectal bleeding.  Past Medical History:  Diagnosis Date   Ganglion cyst of wrist 10/2011   right   Headache(784.0)    migraines   Hx of endometriosis    Kidney stone    Uterus arcuatus    Vaginal Pap smear, abnormal    Past Surgical History:  Procedure Laterality Date   CARPAL TUNNEL RELEASE     right   COLPOSCOPY     GANGLION CYST EXCISION  11/28/2011   Procedure: REMOVAL GANGLION OF WRIST;  Surgeon: Amelie Baize., MD;  Location: [facility];  Service: Orthopedics;  Laterality: Right;  CYST EXCISION RIGHT WRIST   LAPAROSCOPIC CHOLECYSTECTOMY     LEEP     x2   Current Outpatient Medications  Medication Sig Dispense Refill   Na Sulfate-K Sulfate-Mg Sulfate concentrate (SUPREP BOWEL PREP KIT) 17.5-3.13-1.6 GM/177ML SOLN Take 1 kit (354 mLs total) by mouth as directed. For colonoscopy prep 354 mL 0   sertraline (ZOLOFT) 50 MG tablet Take 100 mg by mouth at bedtime.     traZODone (DESYREL) 50 MG tablet Take 50 mg by mouth at bedtime.     No current facility-administered medications for this visit.    Current Outpatient Medications:    Na Sulfate-K Sulfate-Mg Sulfate concentrate (SUPREP BOWEL PREP KIT) 17.5-3.13-1.6 GM/177ML SOLN, Take 1 kit (354 mLs total) by mouth as directed. For colonoscopy prep, Disp: 354 mL, Rfl: 0   sertraline (ZOLOFT) 50 MG tablet, Take 100 mg by mouth at bedtime., Disp: , Rfl:    traZODone (DESYREL) 50 MG tablet, Take 50 mg by mouth at bedtime., Disp: , Rfl:  Allergies  Allergen Reactions   Imitrex [Sumatriptan] Other (See Comments)    Reaction:  Tachycardia    Latex Hives   Shellfish Allergy Hives   Adhesive [Tape] Itching and Rash   Family History  Problem Relation Age of Onset   Hypertension Mother    Hypothyroidism Mother    Hearing loss  Neg Hx    Colon cancer Neg Hx    Esophageal cancer Neg Hx    Inflammatory bowel disease Neg Hx    Liver disease Neg Hx    Pancreatic cancer Neg Hx    Rectal cancer Neg Hx    Stomach cancer Neg Hx    Social History   Socioeconomic History   Marital status: Married    Spouse name: Not on file   Number of children: Not on file   Years of education: Not on file   Highest education level: Not on file  Occupational History   Not on file  Tobacco Use   Smoking status: Never   Smokeless tobacco: Never  Vaping Use   Vaping status: Never Used  Substance and Sexual Activity   Alcohol use: Yes    Comment: occasionally   Drug use: No   Sexual activity: Yes    Birth control/protection: None  Other Topics Concern   Not on file  Social History Narrative   Not on file   Social Drivers of Health   Financial Resource Strain: Not on file  Food Insecurity: Not on file  Transportation Needs: Not on file  Physical Activity: Not on file  Stress: Not on file  Social Connections: Not on file  Intimate Partner Violence: Not on file  Physical Exam: There were no vitals filed for this visit. There is no height or weight on file to calculate BMI. GEN: NAD EYE: Sclerae anicteric ENT: MMM CV: Non-tachycardic GI: Soft, NT/ND NEURO:  Alert & Oriented x 3  Lab Results: No results for input(s): "WBC", "HGB", "HCT", "PLT" in the last 72 hours. BMET No results for input(s): "NA", "K", "CL", "CO2", "GLUCOSE", "BUN", "CREATININE", "CALCIUM" in the last 72 hours. LFT No results for input(s): "PROT", "ALBUMIN", "AST", "ALT", "ALKPHOS", "BILITOT", "BILIDIR", "IBILI" in the last 72 hours. PT/INR No results for input(s): "LABPROT", "INR" in the last 72 hours.   Impression / Plan: This is a 38 y.o.female who presents for Colonoscopy for change in bowel habits and rectal bleeding.  The risks and benefits of endoscopic evaluation/treatment were discussed with the patient and/or family; these  include but are not limited to the risk of perforation, infection, bleeding, missed lesions, lack of diagnosis, severe illness requiring hospitalization, as well as anesthesia and sedation related illnesses.  The patient's history has been reviewed, patient examined, no change in status, and deemed stable for procedure.  The patient and/or family is agreeable to proceed.    Terri Henle, MD [facility] Advanced Endoscopy Office # [phone]

## 2023-06-03 NOTE — Op Note (Signed)
 [facility] Patient Name: Terri Zavala Procedure Date: 06/03/2023 10:34 AM MRN: [id number] Endoscopist: [name] , MD, [id number] Age: 38 Referring MD:  Date of Birth: 05-03-1985 Gender: Female Account #: [id number] Procedure:                Colonoscopy Indications:              Rectal bleeding, Change in bowel habits Medicines:                Monitored Anesthesia Care Procedure:                Pre-Anesthesia Assessment:                           - Prior to the procedure, a History and Physical                            was performed, and patient medications and                            allergies were reviewed. The patient's tolerance of                            previous anesthesia was also reviewed. The risks                            and benefits of the procedure and the sedation                            options and risks were discussed with the patient.                            All questions were answered, and informed consent                            was obtained. Prior Anticoagulants: The patient has                            taken no anticoagulant or antiplatelet agents. ASA                            Grade Assessment: II - A patient with mild systemic                            disease. After reviewing the risks and benefits,                            the patient was deemed in satisfactory condition to                            undergo the procedure.                           After obtaining informed consent, the colonoscope  was passed under direct vision. Throughout the                            procedure, the patient's blood pressure, pulse, and                            oxygen saturations were monitored continuously. The                            Olympus Scope [id number] was introduced through the                            anus and advanced to the 3 cm into the ileum. The                            colonoscopy  was performed without difficulty. The                            patient tolerated the procedure. The quality of the                            bowel preparation was good. The terminal ileum,                            ileocecal valve, appendiceal orifice, and rectum                            were photographed. Scope In: 10:38:15 AM Scope Out: 10:54:02 AM Scope Withdrawal Time: 0 hours 12 minutes 45 seconds  Total Procedure Duration: 0 hours 15 minutes 47 seconds  Findings:                 Skin tags were found on perianal exam.                           The digital rectal exam findings include                            hemorrhoids. Pertinent negatives include no                            palpable rectal lesions.                           The terminal ileum and ileocecal valve appeared                            normal.                           Normal mucosa was found in the entire colon.                            Biopsies for histology were taken with a cold  forceps from the entire colon for evaluation of                            microscopic colitis.                           Normal mucosa was found in the rectum. Biopsies                            were taken with a cold forceps for histology to                            rule out proctitis.                           Non-thrombosed external and internal hemorrhoids                            were found during retroflexion, during perianal                            exam and during digital exam. The hemorrhoids were                            Grade II (internal hemorrhoids that prolapse but                            reduce spontaneously). Complications:            No immediate complications. Estimated Blood Loss:     Estimated blood loss was minimal. Impression:               - Perianal skin tags found on perianal exam.                           - Hemorrhoids found on digital rectal exam.                            - The examined portion of the ileum was normal.                           - Normal mucosa in the entire examined colon.                            Biopsied.                           - Normal mucosa in the rectum. Biopsied.                           - Non-thrombosed external and internal hemorrhoids. Recommendation:           - The patient will be observed post-procedure,                            until all discharge criteria are met.                           -  Discharge patient to home.                           - Patient has a contact number available for                            emergencies. The signs and symptoms of potential                            delayed complications were discussed with the                            patient. Return to normal activities tomorrow.                            Written discharge instructions were provided to the                            patient.                           - High fiber diet.                           - Recommend Metamucil or Benefiber or psyllium in                            effort of trying to bulk stools. Would use daily                            for a week and then increase to twice daily. This                            will hopefully make a difference for patient's                            symptoms of looseness of bowel habits.                           - Await pathology results.                           - SIBO breath testing can be considered if issues                            persist.                           - Recommend Anusol suppositories nightly for 3                            nights in a row to improve inflamed hemorrhoids at                            this time and have available for future  if needed.                           - Repeat colonoscopy in 10 years for screening                            purposes.                           - The findings and recommendations were discussed                             with the patient.                           - The findings and recommendations were discussed                            with the designated responsible adult. [name], MD 06/03/2023 11:03:04 AM

## 2023-06-04 ENCOUNTER — Telehealth: Payer: Self-pay

## 2023-06-04 NOTE — Telephone Encounter (Signed)
 Post procedure follow up call, no answer

## 2023-06-05 LAB — SURGICAL PATHOLOGY

## 2023-06-08 ENCOUNTER — Ambulatory Visit: Payer: Self-pay | Admitting: Gastroenterology
